# Patient Record
Sex: Female | Born: 1945 | Race: White | Hispanic: No | Marital: Married | Smoking: Never smoker
Health system: Southern US, Community
[De-identification: ages and names within clinical notes are randomized; demographics above are authoritative.]

## PROBLEM LIST (undated history)

## (undated) DIAGNOSIS — I639 Cerebral infarction, unspecified: Secondary | ICD-10-CM

## (undated) DIAGNOSIS — I10 Essential (primary) hypertension: Secondary | ICD-10-CM

## (undated) DIAGNOSIS — R569 Unspecified convulsions: Secondary | ICD-10-CM

## (undated) HISTORY — DX: Cerebral infarction, unspecified: I63.9

---

## 2002-01-13 ENCOUNTER — Ambulatory Visit (HOSPITAL_COMMUNITY): Admission: RE | Admit: 2002-01-13 | Discharge: 2002-01-13 | Payer: Self-pay | Admitting: Internal Medicine

## 2003-01-15 ENCOUNTER — Ambulatory Visit (HOSPITAL_COMMUNITY): Admission: RE | Admit: 2003-01-15 | Discharge: 2003-01-15 | Payer: Self-pay | Admitting: Internal Medicine

## 2004-01-17 ENCOUNTER — Ambulatory Visit (HOSPITAL_COMMUNITY): Admission: RE | Admit: 2004-01-17 | Discharge: 2004-01-17 | Payer: Self-pay | Admitting: Internal Medicine

## 2005-01-24 ENCOUNTER — Ambulatory Visit (HOSPITAL_COMMUNITY): Admission: RE | Admit: 2005-01-24 | Discharge: 2005-01-24 | Payer: Self-pay | Admitting: Internal Medicine

## 2006-02-04 ENCOUNTER — Ambulatory Visit (HOSPITAL_COMMUNITY): Admission: RE | Admit: 2006-02-04 | Discharge: 2006-02-04 | Payer: Self-pay | Admitting: Internal Medicine

## 2007-06-06 ENCOUNTER — Ambulatory Visit (HOSPITAL_COMMUNITY): Admission: RE | Admit: 2007-06-06 | Discharge: 2007-06-06 | Payer: Self-pay | Admitting: Internal Medicine

## 2008-06-16 ENCOUNTER — Ambulatory Visit (HOSPITAL_COMMUNITY): Admission: RE | Admit: 2008-06-16 | Discharge: 2008-06-16 | Payer: Self-pay | Admitting: Internal Medicine

## 2009-07-11 ENCOUNTER — Ambulatory Visit (HOSPITAL_COMMUNITY): Admission: RE | Admit: 2009-07-11 | Discharge: 2009-07-11 | Payer: Self-pay | Admitting: Internal Medicine

## 2010-07-14 ENCOUNTER — Ambulatory Visit (HOSPITAL_COMMUNITY)
Admission: RE | Admit: 2010-07-14 | Discharge: 2010-07-14 | Payer: Self-pay | Source: Home / Self Care | Attending: Internal Medicine | Admitting: Internal Medicine

## 2011-01-22 ENCOUNTER — Other Ambulatory Visit (HOSPITAL_COMMUNITY): Payer: Self-pay | Admitting: Internal Medicine

## 2011-01-22 DIAGNOSIS — Z13820 Encounter for screening for osteoporosis: Secondary | ICD-10-CM

## 2011-01-31 ENCOUNTER — Ambulatory Visit (HOSPITAL_COMMUNITY)
Admission: RE | Admit: 2011-01-31 | Discharge: 2011-01-31 | Disposition: A | Discharge status: Home / Self Care | Payer: Medicare Other | Source: Ambulatory Visit | Attending: Internal Medicine | Admitting: Internal Medicine

## 2011-01-31 DIAGNOSIS — Z13820 Encounter for screening for osteoporosis: Secondary | ICD-10-CM | POA: Insufficient documentation

## 2011-01-31 DIAGNOSIS — Z780 Asymptomatic menopausal state: Secondary | ICD-10-CM | POA: Insufficient documentation

## 2011-07-02 ENCOUNTER — Other Ambulatory Visit (HOSPITAL_COMMUNITY): Payer: Self-pay | Admitting: Internal Medicine

## 2011-07-02 DIAGNOSIS — Z1231 Encounter for screening mammogram for malignant neoplasm of breast: Secondary | ICD-10-CM

## 2011-08-02 ENCOUNTER — Ambulatory Visit (HOSPITAL_COMMUNITY)
Admission: RE | Admit: 2011-08-02 | Discharge: 2011-08-02 | Disposition: A | Discharge status: Home / Self Care | Payer: Medicare Other | Source: Ambulatory Visit | Attending: Internal Medicine | Admitting: Internal Medicine

## 2011-08-02 DIAGNOSIS — Z1231 Encounter for screening mammogram for malignant neoplasm of breast: Secondary | ICD-10-CM | POA: Insufficient documentation

## 2011-08-13 ENCOUNTER — Other Ambulatory Visit: Payer: Self-pay | Admitting: Internal Medicine

## 2011-08-13 DIAGNOSIS — R928 Other abnormal and inconclusive findings on diagnostic imaging of breast: Secondary | ICD-10-CM

## 2011-08-17 ENCOUNTER — Ambulatory Visit
Admission: RE | Admit: 2011-08-17 | Discharge: 2011-08-17 | Disposition: A | Discharge status: Home / Self Care | Payer: Medicare Other | Source: Ambulatory Visit | Attending: Internal Medicine | Admitting: Internal Medicine

## 2011-08-17 DIAGNOSIS — R928 Other abnormal and inconclusive findings on diagnostic imaging of breast: Secondary | ICD-10-CM

## 2012-01-14 ENCOUNTER — Other Ambulatory Visit: Payer: Self-pay | Admitting: Internal Medicine

## 2012-01-14 DIAGNOSIS — R921 Mammographic calcification found on diagnostic imaging of breast: Secondary | ICD-10-CM

## 2012-02-15 ENCOUNTER — Other Ambulatory Visit: Payer: Self-pay | Admitting: Internal Medicine

## 2012-02-15 ENCOUNTER — Ambulatory Visit
Admission: RE | Admit: 2012-02-15 | Discharge: 2012-02-15 | Disposition: A | Discharge status: Home / Self Care | Payer: Medicare Other | Source: Ambulatory Visit | Attending: Internal Medicine | Admitting: Internal Medicine

## 2012-02-15 DIAGNOSIS — R921 Mammographic calcification found on diagnostic imaging of breast: Secondary | ICD-10-CM

## 2012-07-16 ENCOUNTER — Other Ambulatory Visit: Payer: Self-pay | Admitting: Internal Medicine

## 2012-07-16 DIAGNOSIS — R921 Mammographic calcification found on diagnostic imaging of breast: Secondary | ICD-10-CM

## 2012-09-09 ENCOUNTER — Ambulatory Visit
Admission: RE | Admit: 2012-09-09 | Discharge: 2012-09-09 | Disposition: A | Discharge status: Home / Self Care | Payer: Medicare Other | Source: Ambulatory Visit | Attending: Internal Medicine | Admitting: Internal Medicine

## 2012-09-09 DIAGNOSIS — R921 Mammographic calcification found on diagnostic imaging of breast: Secondary | ICD-10-CM

## 2012-12-30 ENCOUNTER — Other Ambulatory Visit: Payer: Self-pay | Admitting: Internal Medicine

## 2012-12-30 DIAGNOSIS — R921 Mammographic calcification found on diagnostic imaging of breast: Secondary | ICD-10-CM

## 2013-02-16 ENCOUNTER — Ambulatory Visit
Admission: RE | Admit: 2013-02-16 | Discharge: 2013-02-16 | Disposition: A | Discharge status: Home / Self Care | Payer: Medicare Other | Source: Ambulatory Visit | Attending: Internal Medicine | Admitting: Internal Medicine

## 2013-02-16 DIAGNOSIS — R921 Mammographic calcification found on diagnostic imaging of breast: Secondary | ICD-10-CM

## 2013-07-21 ENCOUNTER — Other Ambulatory Visit: Payer: Self-pay | Admitting: Internal Medicine

## 2013-07-21 DIAGNOSIS — R921 Mammographic calcification found on diagnostic imaging of breast: Secondary | ICD-10-CM

## 2013-08-20 ENCOUNTER — Ambulatory Visit
Admission: RE | Admit: 2013-08-20 | Discharge: 2013-08-20 | Disposition: A | Discharge status: Home / Self Care | Payer: Medicare Other | Source: Ambulatory Visit | Attending: Internal Medicine | Admitting: Internal Medicine

## 2013-08-20 DIAGNOSIS — R921 Mammographic calcification found on diagnostic imaging of breast: Secondary | ICD-10-CM

## 2014-02-17 ENCOUNTER — Other Ambulatory Visit (HOSPITAL_COMMUNITY): Payer: Self-pay | Admitting: Internal Medicine

## 2014-02-17 DIAGNOSIS — M8580 Other specified disorders of bone density and structure, unspecified site: Secondary | ICD-10-CM

## 2014-03-02 ENCOUNTER — Ambulatory Visit (HOSPITAL_COMMUNITY)
Admission: RE | Admit: 2014-03-02 | Discharge: 2014-03-02 | Disposition: A | Discharge status: Home / Self Care | Payer: Medicare Other | Source: Ambulatory Visit | Attending: Internal Medicine | Admitting: Internal Medicine

## 2014-03-02 DIAGNOSIS — Z780 Asymptomatic menopausal state: Secondary | ICD-10-CM | POA: Diagnosis not present

## 2014-03-02 DIAGNOSIS — Z13820 Encounter for screening for osteoporosis: Secondary | ICD-10-CM | POA: Diagnosis not present

## 2014-03-02 DIAGNOSIS — M8580 Other specified disorders of bone density and structure, unspecified site: Secondary | ICD-10-CM

## 2014-08-25 ENCOUNTER — Other Ambulatory Visit (HOSPITAL_COMMUNITY): Payer: Self-pay | Admitting: Internal Medicine

## 2014-08-25 DIAGNOSIS — Z1231 Encounter for screening mammogram for malignant neoplasm of breast: Secondary | ICD-10-CM

## 2014-08-31 ENCOUNTER — Ambulatory Visit (HOSPITAL_COMMUNITY)
Admission: RE | Admit: 2014-08-31 | Discharge: 2014-08-31 | Disposition: A | Discharge status: Home / Self Care | Payer: Medicare Other | Source: Ambulatory Visit | Attending: Internal Medicine | Admitting: Internal Medicine

## 2014-08-31 DIAGNOSIS — Z1231 Encounter for screening mammogram for malignant neoplasm of breast: Secondary | ICD-10-CM

## 2015-10-17 ENCOUNTER — Other Ambulatory Visit: Payer: Self-pay

## 2015-10-17 DIAGNOSIS — Z1231 Encounter for screening mammogram for malignant neoplasm of breast: Secondary | ICD-10-CM

## 2015-11-03 ENCOUNTER — Ambulatory Visit
Admission: RE | Admit: 2015-11-03 | Discharge: 2015-11-03 | Disposition: A | Discharge status: Home / Self Care | Payer: Medicare Other | Source: Ambulatory Visit

## 2015-11-03 DIAGNOSIS — Z1231 Encounter for screening mammogram for malignant neoplasm of breast: Secondary | ICD-10-CM

## 2016-10-08 ENCOUNTER — Other Ambulatory Visit: Payer: Self-pay | Admitting: Internal Medicine

## 2016-10-08 DIAGNOSIS — Z1231 Encounter for screening mammogram for malignant neoplasm of breast: Secondary | ICD-10-CM

## 2016-11-05 ENCOUNTER — Ambulatory Visit
Admission: RE | Admit: 2016-11-05 | Discharge: 2016-11-05 | Disposition: A | Discharge status: Home / Self Care | Payer: Medicare Other | Source: Ambulatory Visit | Attending: Internal Medicine | Admitting: Internal Medicine

## 2016-11-05 DIAGNOSIS — Z1231 Encounter for screening mammogram for malignant neoplasm of breast: Secondary | ICD-10-CM

## 2017-10-11 ENCOUNTER — Other Ambulatory Visit: Payer: Self-pay | Admitting: Internal Medicine

## 2017-10-11 DIAGNOSIS — Z1231 Encounter for screening mammogram for malignant neoplasm of breast: Secondary | ICD-10-CM

## 2017-11-08 ENCOUNTER — Ambulatory Visit
Admission: RE | Admit: 2017-11-08 | Discharge: 2017-11-08 | Disposition: A | Discharge status: Home / Self Care | Payer: Medicare Other | Source: Ambulatory Visit | Attending: Internal Medicine | Admitting: Internal Medicine

## 2017-11-08 DIAGNOSIS — Z1231 Encounter for screening mammogram for malignant neoplasm of breast: Secondary | ICD-10-CM

## 2018-01-24 ENCOUNTER — Emergency Department (HOSPITAL_BASED_OUTPATIENT_CLINIC_OR_DEPARTMENT_OTHER): Payer: Medicare Other

## 2018-01-24 ENCOUNTER — Other Ambulatory Visit: Payer: Self-pay

## 2018-01-24 ENCOUNTER — Observation Stay (HOSPITAL_BASED_OUTPATIENT_CLINIC_OR_DEPARTMENT_OTHER)
Admission: EM | Admit: 2018-01-24 | Discharge: 2018-01-27 | Disposition: A | Discharge status: Home / Self Care | Payer: Medicare Other | Attending: Internal Medicine | Admitting: Internal Medicine

## 2018-01-24 ENCOUNTER — Encounter (HOSPITAL_BASED_OUTPATIENT_CLINIC_OR_DEPARTMENT_OTHER): Payer: Self-pay

## 2018-01-24 DIAGNOSIS — R42 Dizziness and giddiness: Secondary | ICD-10-CM | POA: Diagnosis present

## 2018-01-24 DIAGNOSIS — I639 Cerebral infarction, unspecified: Secondary | ICD-10-CM

## 2018-01-24 DIAGNOSIS — G459 Transient cerebral ischemic attack, unspecified: Secondary | ICD-10-CM

## 2018-01-24 DIAGNOSIS — G40909 Epilepsy, unspecified, not intractable, without status epilepticus: Secondary | ICD-10-CM | POA: Diagnosis not present

## 2018-01-24 DIAGNOSIS — I672 Cerebral atherosclerosis: Secondary | ICD-10-CM

## 2018-01-24 DIAGNOSIS — Q211 Atrial septal defect: Secondary | ICD-10-CM | POA: Diagnosis not present

## 2018-01-24 DIAGNOSIS — I6330 Cerebral infarction due to thrombosis of unspecified cerebral artery: Principal | ICD-10-CM | POA: Insufficient documentation

## 2018-01-24 DIAGNOSIS — Z79899 Other long term (current) drug therapy: Secondary | ICD-10-CM | POA: Diagnosis not present

## 2018-01-24 DIAGNOSIS — E785 Hyperlipidemia, unspecified: Secondary | ICD-10-CM

## 2018-01-24 DIAGNOSIS — I10 Essential (primary) hypertension: Secondary | ICD-10-CM | POA: Diagnosis not present

## 2018-01-24 DIAGNOSIS — E669 Obesity, unspecified: Secondary | ICD-10-CM | POA: Diagnosis not present

## 2018-01-24 DIAGNOSIS — Z6838 Body mass index (BMI) 38.0-38.9, adult: Secondary | ICD-10-CM | POA: Insufficient documentation

## 2018-01-24 DIAGNOSIS — R4781 Slurred speech: Secondary | ICD-10-CM | POA: Diagnosis not present

## 2018-01-24 DIAGNOSIS — R479 Unspecified speech disturbances: Secondary | ICD-10-CM | POA: Diagnosis present

## 2018-01-24 DIAGNOSIS — Z792 Long term (current) use of antibiotics: Secondary | ICD-10-CM | POA: Insufficient documentation

## 2018-01-24 HISTORY — DX: Essential (primary) hypertension: I10

## 2018-01-24 HISTORY — DX: Unspecified convulsions: R56.9

## 2018-01-24 LAB — COMPREHENSIVE METABOLIC PANEL
ALT: 30 U/L (ref 14–54)
AST: 27 U/L (ref 15–41)
Albumin: 3.8 g/dL (ref 3.5–5.0)
Alkaline Phosphatase: 124 U/L (ref 38–126)
Anion gap: 8 (ref 5–15)
BUN: 15 mg/dL (ref 6–20)
CALCIUM: 9.1 mg/dL (ref 8.9–10.3)
CO2: 24 mmol/L (ref 22–32)
CREATININE: 0.64 mg/dL (ref 0.44–1.00)
Chloride: 110 mmol/L (ref 101–111)
Glucose, Bld: 113 mg/dL — ABNORMAL HIGH (ref 65–99)
Potassium: 3.5 mmol/L (ref 3.5–5.1)
Sodium: 142 mmol/L (ref 135–145)
Total Bilirubin: 0.5 mg/dL (ref 0.3–1.2)
Total Protein: 6.8 g/dL (ref 6.5–8.1)

## 2018-01-24 LAB — CBC
HCT: 40.7 % (ref 36.0–46.0)
HEMOGLOBIN: 14.3 g/dL (ref 12.0–15.0)
MCH: 31.7 pg (ref 26.0–34.0)
MCHC: 35.1 g/dL (ref 30.0–36.0)
MCV: 90.2 fL (ref 78.0–100.0)
Platelets: 248 10*3/uL (ref 150–400)
RBC: 4.51 MIL/uL (ref 3.87–5.11)
RDW: 12.6 % (ref 11.5–15.5)
WBC: 6.2 10*3/uL (ref 4.0–10.5)

## 2018-01-24 LAB — PHENYTOIN LEVEL, TOTAL: PHENYTOIN LVL: 16.8 ug/mL (ref 10.0–20.0)

## 2018-01-24 LAB — RAPID URINE DRUG SCREEN, HOSP PERFORMED
Amphetamines: NOT DETECTED
Benzodiazepines: NOT DETECTED
Cocaine: NOT DETECTED
OPIATES: NOT DETECTED
Tetrahydrocannabinol: NOT DETECTED

## 2018-01-24 LAB — CBG MONITORING, ED: Glucose-Capillary: 93 mg/dL (ref 65–99)

## 2018-01-24 LAB — TROPONIN I

## 2018-01-24 NOTE — ED Provider Notes (Addendum)
[facility] EMERGENCY DEPARTMENT Provider Note   CSN: [id number] Arrival date & time: 01/24/18  2229     History   Chief Complaint Chief Complaint  Patient presents with  . Dizziness    HPI April Mcdonald is a 72 y.o. female.  Pt is 72 y/o with hx of HTN, HLD, and sz disorder presenting today with dizziness which she describes as spinning, slurred speech and not feeling right with minimal headache for about 5 min today that then resolved.  Pt states she feels fine now.  She had no LOC, shaking or weakness to one side of the body.  She was not confused after this event and husband states she had no facial droop.  No aphasia or visual changes.  Pt states she was just sitting watching TV when sx started.  No similar sx in the past.  Pt states last sz was >20 years ago and was described as generalized shaking and loss of consciousness.  She has been on dilantin since and has never had another sx.  Also tonight no CP, SOB, neck pain or abd pain.  No vomiting and pt has not recently been ill or had any med changes.  The history is provided by the patient.    Past Medical History:  Diagnosis Date  . Hypertension   . Seizures (HCC)     There are no active problems to display for this patient.   No past surgical history on file.   OB History   None      Home Medications    Prior to Admission medications   Medication Sig Start Date End Date Taking? Authorizing Provider  amoxicillin (AMOXIL) 500 MG capsule Take 500 mg by mouth 3 (three) times daily.   Yes [provider]  diphenoxylate-atropine (LOMOTIL) 2.5-0.025 MG tablet Take by mouth 4 (four) times daily as needed for diarrhea or loose stools.   Yes [provider]  ibuprofen (ADVIL,MOTRIN) 100 MG tablet Take 100 mg by mouth every 6 (six) hours as needed for fever.   Yes [provider]  phenytoin (DILANTIN) 100 MG ER capsule Take by mouth 3 (three) times daily.   Yes [provider]  triamterene-hydrochlorothiazide (DYAZIDE) 37.5-25 MG capsule Take 1 capsule by mouth daily.   Yes [provider]    Family History Family History  Problem Relation Age of Onset  . Breast cancer Cousin     Social History Social History   Tobacco Use  . Smoking status: Not on file  Substance Use Topics  . Alcohol use: Not on file  . Drug use: Not on file     Allergies   Patient has no allergy information on record.   Review of Systems Review of Systems  All other systems reviewed and are negative.    Physical Exam Updated Vital Signs BP (!) 167/65 (BP Location: Right Arm)   Pulse 67   Temp 98 F (36.7 C) (Oral)   Resp 18   Ht 5\' 2"  (1.575 m)   Wt 93.4 kg (206 lb)   SpO2 99%   BMI 37.68 kg/m   Physical Exam  Constitutional: She is oriented to person, place, and time. She appears well-developed and well-nourished. No distress.  HENT:  Head: Normocephalic and atraumatic.  Mouth/Throat: Oropharynx is clear and moist.  Eyes: Pupils are equal, round, and reactive to light. Conjunctivae and EOM are normal.  Neck: Normal range of motion. Neck supple.  No carotid bruit  Cardiovascular:  Normal rate, regular rhythm and intact distal pulses.  No murmur heard. Pulmonary/Chest: Effort normal and breath sounds normal. No respiratory distress. She has no wheezes. She has no rales.  Abdominal: Soft. She exhibits no distension. There is no tenderness. There is no rebound and no guarding.  Musculoskeletal: Normal range of motion. She exhibits no edema or tenderness.  Neurological: She is alert and oriented to person, place, and time. She has normal strength. No cranial nerve deficit or sensory deficit. Coordination and gait normal.  No pronator drift.  No visual field cuts or nystagmus.  Normal heel to shin and finger to nose bilaterally.  Skin: Skin is warm and dry. No rash noted. No erythema.  Psychiatric: She has a normal mood and affect. Her behavior is  normal.  Nursing note and vitals reviewed.    ED Treatments / Results  Labs (all labs ordered are listed, but only abnormal results are displayed) Labs Reviewed  RAPID URINE DRUG SCREEN, HOSP PERFORMED - Abnormal; Notable for the following components:      Result Value   Barbiturates   (*)    Value: Result not available. Reagent lot number recalled by manufacturer.   All other components within normal limits  COMPREHENSIVE METABOLIC PANEL - Abnormal; Notable for the following components:   Glucose, Bld 113 (*)    All other components within normal limits  CBC  TROPONIN I  PHENYTOIN LEVEL, TOTAL  CBG MONITORING, ED    EKG None ED ECG REPORT   Date: 01/24/2018  Rate: 67  Rhythm: normal sinus rhythm  QRS Axis: normal  Intervals: normal  ST/T Wave abnormalities: normal  Conduction Disutrbances:none  Narrative Interpretation:   Old EKG Reviewed: unchanged  I have personally reviewed the EKG tracing and agree with the computerized printout as noted.  Radiology Ct Head Wo Contrast  Result Date: 01/25/2018 CLINICAL DATA:  Dizziness and slurred speech [place] EXAM: CT HEAD WITHOUT CONTRAST TECHNIQUE: Contiguous axial images were obtained from the base of the skull through the vertex without intravenous contrast. COMPARISON:  None. FINDINGS: Brain: No acute territorial infarction, hemorrhage or intracranial mass. Ventricles are nonenlarged. 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 Mild atrophy. Vascular: No hyperdense vessels.  Carotid vascular calcification Skull: Normal. Negative for fracture or focal lesion. Sinuses/Orbits: Fluid level in the right maxillary sinus consistent with sinusitis. No acute orbital abnormality Other: None IMPRESSION: 1. No CT evidence for acute intracranial abnormality. 2. 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  Atrophy. Electronically Signed   By: Jasmine Pang M.D.   On: 01/25/2018 00:54    Procedures Procedures (including critical care  time)  Medications Ordered in ED Medications - No data to display   Initial Impression / Assessment and Plan / ED Course  I have reviewed the triage vital signs and the nursing notes.  Pertinent labs & imaging results that were available during my care of the patient were reviewed by me and considered in my medical decision making (see chart for details).     Pt presenting today with sx concerning for TIA.  Pt had or so of dizziness which she describes as movement/spinning, slurred speech and didn't feel right.  No facial droop, or unilateral sx.  No prior hx of similar.  Pt does have sz disorder but last sz was >85yrs ago and still on dilantin.  Sx free at this time and normal exam.  She does have risk factors (age, HTN and HLD).  She denies any neck or head pain now.  No recent med changes. TIA work up initiated.  12:45 AM Labs wnl.  CT without acute findings but appearance of old small right posterior frontal infarct.  Pt remains assymptomatic.  Will admit for TIA work up  Final Clinical Impressions(s) / ED Diagnoses   Final diagnoses:  TIA (transient ischemic attack)    ED Discharge Orders    None       Gwyneth Sprout, MD 01/25/18 0111    Gwyneth Sprout, MD 01/25/18 [phone]

## 2018-01-24 NOTE — ED Triage Notes (Signed)
Pt reports she was watching TV approx 10pm and had an episode of dizziness associated with slurred speech. Symptoms have resolved. Neuro intact on assessment. Pt ambulatory inside building.

## 2018-01-25 ENCOUNTER — Observation Stay (HOSPITAL_BASED_OUTPATIENT_CLINIC_OR_DEPARTMENT_OTHER): Payer: Medicare Other

## 2018-01-25 ENCOUNTER — Observation Stay (HOSPITAL_COMMUNITY): Payer: Medicare Other

## 2018-01-25 ENCOUNTER — Encounter (HOSPITAL_COMMUNITY): Payer: Self-pay | Admitting: Family Medicine

## 2018-01-25 ENCOUNTER — Other Ambulatory Visit: Payer: Self-pay

## 2018-01-25 DIAGNOSIS — E785 Hyperlipidemia, unspecified: Secondary | ICD-10-CM | POA: Diagnosis not present

## 2018-01-25 DIAGNOSIS — I10 Essential (primary) hypertension: Secondary | ICD-10-CM | POA: Diagnosis present

## 2018-01-25 DIAGNOSIS — G459 Transient cerebral ischemic attack, unspecified: Secondary | ICD-10-CM | POA: Diagnosis not present

## 2018-01-25 DIAGNOSIS — Z7982 Long term (current) use of aspirin: Secondary | ICD-10-CM | POA: Diagnosis not present

## 2018-01-25 DIAGNOSIS — G40909 Epilepsy, unspecified, not intractable, without status epilepticus: Secondary | ICD-10-CM | POA: Diagnosis not present

## 2018-01-25 DIAGNOSIS — R479 Unspecified speech disturbances: Secondary | ICD-10-CM | POA: Diagnosis not present

## 2018-01-25 DIAGNOSIS — Z79899 Other long term (current) drug therapy: Secondary | ICD-10-CM | POA: Diagnosis not present

## 2018-01-25 DIAGNOSIS — R569 Unspecified convulsions: Secondary | ICD-10-CM | POA: Diagnosis not present

## 2018-01-25 DIAGNOSIS — R42 Dizziness and giddiness: Secondary | ICD-10-CM | POA: Diagnosis present

## 2018-01-25 LAB — LIPID PANEL
Cholesterol: 178 mg/dL (ref 0–200)
HDL: 51 mg/dL (ref >40)
LDL CALC: 110 mg/dL — AB (ref 0–99)
Total CHOL/HDL Ratio: 3.5 RATIO
Triglycerides: 85 mg/dL (ref <150)
VLDL: 17 mg/dL (ref 0–40)

## 2018-01-25 LAB — ECHOCARDIOGRAM COMPLETE
Height: 61 in
WEIGHTICAEL: 3301.61 [oz_av]

## 2018-01-25 LAB — HEMOGLOBIN A1C
Hgb A1c MFr Bld: 5.8 % — ABNORMAL HIGH (ref 4.8–5.6)
Mean Plasma Glucose: 119.76 mg/dL

## 2018-01-25 MED ORDER — IOPAMIDOL (ISOVUE-370) INJECTION 76%
50.0000 mL | Freq: Once | INTRAVENOUS | Status: AC | PRN
  Administered 2018-01-25: 50 mL via INTRAVENOUS

## 2018-01-25 MED ORDER — ATORVASTATIN CALCIUM 80 MG PO TABS: 80.0000 mg | ORAL_TABLET | Freq: Every day | ORAL | 0 refills | Status: AC

## 2018-01-25 MED ORDER — ASPIRIN 81 MG PO TBEC: 81.0000 mg | DELAYED_RELEASE_TABLET | Freq: Every day | ORAL | 0 refills | Status: AC

## 2018-01-25 MED ORDER — ENOXAPARIN SODIUM 40 MG/0.4ML ~~LOC~~ SOLN
40.0000 mg | SUBCUTANEOUS | Status: DC
  Administered 2018-01-25 – 2018-01-27 (×3): 40 mg via SUBCUTANEOUS
  Filled 2018-01-25 (×3): qty 0.4

## 2018-01-25 MED ORDER — CLOPIDOGREL BISULFATE 75 MG PO TABS: 75.0000 mg | ORAL_TABLET | Freq: Every day | ORAL | 0 refills | Status: DC

## 2018-01-25 MED ORDER — SODIUM CHLORIDE 0.9% FLUSH
3.0000 mL | Freq: Two times a day (BID) | INTRAVENOUS | Status: DC
  Administered 2018-01-25 – 2018-01-27 (×3): 3 mL via INTRAVENOUS

## 2018-01-25 MED ORDER — IOPAMIDOL (ISOVUE-370) INJECTION 76%: 50.0000 mL | Freq: Once | INTRAVENOUS | Status: DC | PRN

## 2018-01-25 MED ORDER — TRIAMTERENE-HCTZ 37.5-25 MG PO TABS
1.0000 | ORAL_TABLET | Freq: Every day | ORAL | Status: DC
  Administered 2018-01-25: 1 via ORAL
  Filled 2018-01-25: qty 1

## 2018-01-25 MED ORDER — ATORVASTATIN CALCIUM 10 MG PO TABS: 10.0000 mg | ORAL_TABLET | Freq: Every day | ORAL | Status: DC

## 2018-01-25 MED ORDER — SODIUM CHLORIDE 0.9% FLUSH: 3.0000 mL | INTRAVENOUS | Status: DC | PRN

## 2018-01-25 MED ORDER — ACETAMINOPHEN 325 MG PO TABS: 650.0000 mg | ORAL_TABLET | ORAL | Status: DC | PRN

## 2018-01-25 MED ORDER — ASPIRIN 325 MG PO TABS
325.0000 mg | ORAL_TABLET | Freq: Every day | ORAL | Status: DC
  Administered 2018-01-25: 325 mg via ORAL
  Filled 2018-01-25: qty 1

## 2018-01-25 MED ORDER — ACETAMINOPHEN 160 MG/5ML PO SOLN: 650.0000 mg | ORAL | Status: DC | PRN

## 2018-01-25 MED ORDER — ASPIRIN 300 MG RE SUPP: 300.0000 mg | Freq: Every day | RECTAL | Status: DC

## 2018-01-25 MED ORDER — PHENYTOIN SODIUM EXTENDED 100 MG PO CAPS
100.0000 mg | ORAL_CAPSULE | Freq: Three times a day (TID) | ORAL | Status: DC
  Filled 2018-01-25 (×3): qty 1

## 2018-01-25 MED ORDER — ASPIRIN EC 81 MG PO TBEC
81.0000 mg | DELAYED_RELEASE_TABLET | Freq: Every day | ORAL | Status: DC
  Administered 2018-01-26 – 2018-01-27 (×2): 81 mg via ORAL
  Filled 2018-01-25 (×2): qty 1

## 2018-01-25 MED ORDER — SENNOSIDES-DOCUSATE SODIUM 8.6-50 MG PO TABS: 1.0000 | ORAL_TABLET | Freq: Every evening | ORAL | Status: DC | PRN

## 2018-01-25 MED ORDER — ATORVASTATIN CALCIUM 80 MG PO TABS
80.0000 mg | ORAL_TABLET | Freq: Every day | ORAL | Status: DC
  Administered 2018-01-25 – 2018-01-26 (×2): 80 mg via ORAL
  Filled 2018-01-25 (×2): qty 1

## 2018-01-25 MED ORDER — CLOPIDOGREL BISULFATE 75 MG PO TABS
75.0000 mg | ORAL_TABLET | Freq: Every day | ORAL | Status: DC
  Administered 2018-01-25 – 2018-01-27 (×3): 75 mg via ORAL
  Filled 2018-01-25 (×3): qty 1

## 2018-01-25 MED ORDER — STROKE: EARLY STAGES OF RECOVERY BOOK
Freq: Once | Status: AC
  Administered 2018-01-25: 06:00:00
  Filled 2018-01-25: qty 1

## 2018-01-25 MED ORDER — ACETAMINOPHEN 650 MG RE SUPP: 650.0000 mg | RECTAL | Status: DC | PRN

## 2018-01-25 MED ORDER — SODIUM CHLORIDE 0.9 % IV SOLN: 250.0000 mL | INTRAVENOUS | Status: DC | PRN

## 2018-01-25 MED ORDER — ONDANSETRON HCL 4 MG/2ML IJ SOLN: 4.0000 mg | Freq: Four times a day (QID) | INTRAMUSCULAR | Status: DC | PRN

## 2018-01-25 MED ORDER — TRIAMTERENE-HCTZ 37.5-25 MG PO CAPS: 1.0000 | ORAL_CAPSULE | Freq: Every day | ORAL | Status: AC

## 2018-01-25 NOTE — H&P (Signed)
History and Physical    April KnudsenGwyn O Mcdonald ONG:[id number] DOB: [birth year] DOA: 01/24/2018  PCP: Roberts GaudyBurns, Kevin L., MD   [facility]  Chief Complaint: Slurred speech, dizziness   HPI: April KnudsenGwyn O Mcdonald is a 72 y.o. female with medical history significant for hypertension, hyperlipidemia, and seizure disorder, now presenting to the emergency department after an episode of slurred speech and dizziness.  Patient reports that she was in her usual state of health and having an uneventful day when she was typing on a computer and watching TV at approximately 10 PM last night.  She then developed acute onset of dizziness, described as though the room was spinning, and was noted to have dysarthria at that time.  Her husband, present during the episode, is here at the bedside and reports that the symptoms seem to resolve within about 5 minutes.  Patient denies any recent illness, fall, or trauma, and denies experiencing these symptoms previously.  There was no palpitations or chest discomfort and no swelling or tenderness in the lower extremity.  [facility] ED Course: Upon arrival to the ED, patient is found to be afebrile, saturating well on room air, and with vitals otherwise normal.  EKG features a sinus rhythm and noncontrast head CT is negative for acute intracranial abnormality, but notable for an old small right posterior frontal infarct.  Chemistry panel and CBC are unremarkable, troponin is undetectable, UDS is negative, and phenytoin level is therapeutic.  [facility] further evaluation and management of transient dysarthria and dizziness concerning for possible TIA/CVA.  Review of Systems:  All other systems reviewed and apart from HPI, are negative.  Past Medical History:  Diagnosis Date  . Hypertension   . Seizures (HCC)     History reviewed. No pertinent surgical history.   reports that she has never smoked. She has  never used smokeless tobacco. She reports that she does not drink alcohol or use drugs.   Family History  Problem Relation Age of Onset  . Breast cancer Cousin      Prior to Admission medications   Medication Sig Start Date End Date Taking? Authorizing Provider  amoxicillin (AMOXIL) 500 MG capsule Take 500 mg by mouth 3 (three) times daily.   Yes [provider]  diphenoxylate-atropine (LOMOTIL) 2.5-0.025 MG tablet Take by mouth 4 (four) times daily as needed for diarrhea or loose stools.   Yes [provider]  ibuprofen (ADVIL,MOTRIN) 100 MG tablet Take 100 mg by mouth every 6 (six) hours as needed for fever.   Yes [provider]  phenytoin (DILANTIN) 100 MG ER capsule Take by mouth 3 (three) times daily.   Yes [provider]  triamterene-hydrochlorothiazide (DYAZIDE) 37.5-25 MG capsule Take 1 capsule by mouth daily.   Yes [provider]    Physical Exam: Vitals:   01/25/18 0030 01/25/18 0153 01/25/18 0208 01/25/18 0300  BP: (!) 123/50 136/62  (!) 155/61  Pulse: 62 65  71  Resp: 18 18  17   Temp:   98.4 F (36.9 C) (!) 97.5 F (36.4 C)  TempSrc:    Oral  SpO2: 99% 99%  100%  Weight:    93.6 kg (206 lb 5.6 oz)  Height:    5\' 1"  (1.549 m)      Constitutional: NAD, calm  Eyes: PERTLA, lids and conjunctivae normal ENMT: Mucous membranes are moist. No deformity.   Neck: normal, supple, no masses, no  thyromegaly Respiratory: clear to auscultation bilaterally, no wheezing, no crackles. Normal respiratory effort.    Cardiovascular: S1 & S2 heard, regular rate and rhythm. No extremity edema.   Abdomen: No distension, no tenderness, soft. Bowel sounds normal.  Musculoskeletal: no clubbing / cyanosis. No joint deformity upper and lower extremities.   Skin: no significant rashes, lesions, ulcers. Warm, dry, well-perfused. Neurologic: No facial asymmetry, PERRL, EOMI. Sensation intact. No motor deficit appreciated.  Psychiatric:  Alert  and oriented x 3. Pleasant and cooperative.     Labs on Admission: I have personally reviewed following labs and imaging studies  CBC: Recent Labs  Lab 01/24/18 2322  WBC 6.2  HGB 14.3  HCT 40.7  MCV 90.2  PLT 248   Basic Metabolic Panel: Recent Labs  Lab 01/24/18 2322  NA 142  K 3.5  CL 110  CO2 24  GLUCOSE 113*  BUN 15  CREATININE 0.64  CALCIUM 9.1   GFR: Estimated Creatinine Clearance: 66.3 mL/min (by C-G formula based on SCr of 0.64 mg/dL). Liver Function Tests: Recent Labs  Lab 01/24/18 2322  AST 27  ALT 30  ALKPHOS 124  BILITOT 0.5  PROT 6.8  ALBUMIN 3.8   No results for input(s): LIPASE, AMYLASE in the last 168 hours. No results for input(s): AMMONIA in the last 168 hours. Coagulation Profile: No results for input(s): INR, PROTIME in the last 168 hours. Cardiac Enzymes: Recent Labs  Lab 01/24/18 2322  TROPONINI <0.03   BNP (last 3 results) No results for input(s): PROBNP in the last 8760 hours. HbA1C: No results for input(s): HGBA1C in the last 72 hours. CBG: Recent Labs  Lab 01/24/18 2253  GLUCAP 93   Lipid Profile: No results for input(s): CHOL, HDL, LDLCALC, TRIG, CHOLHDL, LDLDIRECT in the last 72 hours. Thyroid Function Tests: No results for input(s): TSH, T4TOTAL, FREET4, T3FREE, THYROIDAB in the last 72 hours. Anemia Panel: No results for input(s): VITAMINB12, FOLATE, FERRITIN, TIBC, IRON, RETICCTPCT in the last 72 hours. Urine analysis: No results found for: COLORURINE, APPEARANCEUR, LABSPEC, PHURINE, GLUCOSEU, HGBUR, BILIRUBINUR, KETONESUR, PROTEINUR, UROBILINOGEN, NITRITE, LEUKOCYTESUR Sepsis Labs: @LABRCNTIP (procalcitonin:4,lacticidven:4) )No results found for this or any previous visit (from the past 240 hour(s)).   Radiological Exams on Admission: Ct Head Wo Contrast  Result Date: 01/25/2018 CLINICAL DATA:  Dizziness and slurred speech [place] EXAM: CT HEAD WITHOUT CONTRAST TECHNIQUE: Contiguous axial images were obtained  from the base of the skull through the vertex without intravenous contrast. COMPARISON:  None. FINDINGS: Brain: No acute territorial infarction, hemorrhage or intracranial mass. Ventricles are nonenlarged. 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 Mild atrophy. Vascular: No hyperdense vessels.  Carotid vascular calcification Skull: Normal. Negative for fracture or focal lesion. Sinuses/Orbits: Fluid level in the right maxillary sinus consistent with sinusitis. No acute orbital abnormality Other: None IMPRESSION: 1. No CT evidence for acute intracranial abnormality. 2. 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  Atrophy. Electronically Signed   By: Jasmine Pang M.D.   On: 01/25/2018 00:54    EKG: Independently reviewed. Sinus rhythm.   Assessment/Plan   1. Transient dysarthria  - Presents following an episode of dysarthria and dizziness that resolved after ~5 minutes  - Head CT was negative for acute findings and no focal deficit identified  - Check MRI brain, MRA head, carotid dopplers, echocardiogram, fasting lipids, A1c  - Continue cardiac monitoring with frequent neuro checks  - Continue Lipitor, ASA   2. Hypertension  - BP at goal  - Continue triamterene-HCTZ   3. Hyperlipidemia  -  Continue Lipitor    4. Seizure disorder  - Hx of generalized seizures, none in many years  - Continues to take Dilantin and level therapeutic in ED     DVT prophylaxis:  Loveonx Code Status: Full  Family Communication: Husband and daughter updated at bedside Consults called: None Admission status: Observation     Briscoe Deutscher, MD [facility] Pager [phone]  If 7PM-7AM, please contact night-coverage [email or web address] Password [facility] : [facility]  01/25/2018, 4:09 AM

## 2018-01-25 NOTE — Consult Note (Signed)
Referring Physician: Dr Antionette Char Consult reason: Stroke  Chief Complaint: none  HPI: April Mcdonald is an 72 y.o. female who has medical history of HTN, HLD and seizure disorder. She presented to a local Urgent Care center on evening of 6/21 with sudden onset of dizziness, slurred speech that lasted about 5-48min and then she states she simply felt "unwell". Urgent care sent her to [facility] ER for TIA work up. CTH showed no acute findings, but MRI shows. She denies any CP or palpitations or SOB. She had no LOC or vision changes. She said she had a focal right h/a thereafter, but this is now gone and all symptoms have resolved at this time. NIH 0. To note, her seizure disorder has been well controlled on Dilantin and has been seizure free for over 77yrs and at baseline is healthy and active (mRS 0).  Past Medical History Past Medical History:  Diagnosis Date  . Hypertension   . Seizures (HCC)   Hyperlipidemia  Surgical History History reviewed. No pertinent surgical history.  Family History  Family History  Problem Relation Age of Onset  . Breast cancer Cousin     Social History:   reports that she has never smoked. She has never used smokeless tobacco. She reports that she does not drink alcohol or use drugs.  Allergies:  Not on File  Home Medications:  Medications Prior to Admission  Medication Sig Dispense Refill  . amoxicillin (AMOXIL) 500 MG capsule Take 500 mg by mouth 3 (three) times daily.    . diphenoxylate-atropine (LOMOTIL) 2.5-0.025 MG tablet Take by mouth 4 (four) times daily as needed for diarrhea or loose stools.    Marland Kitchen ibuprofen (ADVIL,MOTRIN) 100 MG tablet Take 100 mg by mouth every 6 (six) hours as needed for fever.    . phenytoin (DILANTIN) 100 MG ER capsule Take by mouth 3 (three) times daily.    Marland Kitchen triamterene-hydrochlorothiazide (DYAZIDE) 37.5-25 MG capsule Take 1 capsule by mouth daily.      Hospital Medications . aspirin  300 mg Rectal Daily   Or  . aspirin  325 mg  Oral Daily  . atorvastatin  10 mg Oral q1800  . enoxaparin (LOVENOX) injection  40 mg Subcutaneous Q24H  . phenytoin  100 mg Oral TID  . sodium chloride flush  3 mL Intravenous Q12H    ROS:  History obtained from pt  General ROS: negative for - chills, fatigue, fever, night sweats, weight gain or weight loss Psychological ROS: negative for - behavioral disorder, hallucinations, memory difficulties, mood swings or suicidal ideation Ophthalmic ROS: negative for - blurry vision, double vision, eye pain or loss of vision ENT ROS: negative for - epistaxis, nasal discharge, oral lesions, sore throat, tinnitus or vertigo Allergy and Immunology ROS: negative for - hives or itchy/watery eyes Hematological and Lymphatic ROS: negative for - bleeding problems, bruising or swollen lymph nodes Endocrine ROS: negative for - galactorrhea, hair pattern changes, polydipsia/polyuria or temperature intolerance Respiratory ROS: negative for - cough, hemoptysis, shortness of breath or wheezing Cardiovascular ROS: negative for - chest pain, dyspnea on exertion, edema or irregular heartbeat Gastrointestinal ROS: negative for - abdominal pain, diarrhea, hematemesis, nausea/vomiting or stool incontinence Genito-Urinary ROS: negative for - dysuria, hematuria, incontinence or urinary frequency/urgency Musculoskeletal ROS: negative for - joint swelling or muscular weakness Neurological ROS: as noted in HPI Dermatological ROS: negative for rash and skin lesion changes   Physical Examination:  Vitals:   01/25/18 1610 01/25/18 0725 01/25/18 0915 01/25/18 1114  BP: (!) 148/67 (!) 147/68 (!) 133/49 (!) 156/84  Pulse: 66 67 82 69  Resp: 18 17    Temp: 97.8 F (36.6 C) 98.1 F (36.7 C)    TempSrc: Oral Oral    SpO2: 100% 97% 100% 95%  Weight:      Height:        General - no acute distress Heart - Regular rate and rhythm - no murmer appreciated Lungs - Clear to auscultation  Abdomen - Soft - non  tender Extremities - Distal pulses intact - no edema Skin - Warm and dry   Neurologic Examination:  Mental Status: Alert, oriented, thought content appropriate.  Speech fluent without evidence of aphasia. Able to follow 3 step commands without difficulty. Cranial Nerves: II: Discs not visualized; Visual fields grossly normal, pupils equal, round, reactive to light III,IV, VI: ptosis not present, extra-ocular motions intact bilaterally V,VII: smile symmetric, facial light touch sensation normal bilaterally VIII: hearing normal bilaterally IX,X: gag reflex present XI: bilateral shoulder shrug XII: midline tongue extension Motor: RUE - 5/5    LUE - 5/5   RLE - 5/5    LLE - 5/5 Tone and bulk:normal tone throughout; no atrophy noted Sensory: Light touch intact throughout, bilaterally Deep Tendon Reflexes: 2+ and symmetric throughout Plantars: Right: downgoing   Left: downgoing Cerebellar: normal finger-to-nose and normal heel-to-shin test Gait: deferred at this time.   NIHSS 1a Level of Conscious: 1b LOC Questions:  1c LOC Commands:  2 Best Gaze:  3 Visual:  4 Facial Palsy:  5a Motor Arm - left:  5b Motor Arm - Right:  6a Motor Leg - Left:  6b Motor Leg - Right:  7 Limb Ataxia:  8 Sensory:  9 Best Language:  10 Dysarthria: 11 Extinct. and Inattention: TOTAL: 0   LABORATORY STUDIES:  Basic Metabolic Panel: Recent Labs  Lab 01/24/18 2322  NA 142  K 3.5  CL 110  CO2 24  GLUCOSE 113*  BUN 15  CREATININE 0.64  CALCIUM 9.1    Liver Function Tests: Recent Labs  Lab 01/24/18 2322  AST 27  ALT 30  ALKPHOS 124  BILITOT 0.5  PROT 6.8  ALBUMIN 3.8   No results for input(s): LIPASE, AMYLASE in the last 168 hours. No results for input(s): AMMONIA in the last 168 hours.  CBC: Recent Labs  Lab 01/24/18 2322  WBC 6.2  HGB 14.3  HCT 40.7  MCV 90.2  PLT 248    Cardiac Enzymes: Recent Labs  Lab 01/24/18 2322  TROPONINI <0.03    BNP: Invalid  input(s): POCBNP  CBG: Recent Labs  Lab 01/24/18 2253  GLUCAP 93    Microbiology:   Coagulation Studies: No results for input(s): LABPROT, INR in the last 72 hours.  Urinalysis: No results for input(s): COLORURINE, LABSPEC, PHURINE, GLUCOSEU, HGBUR, BILIRUBINUR, KETONESUR, PROTEINUR, UROBILINOGEN, NITRITE, LEUKOCYTESUR in the last 168 hours.  Invalid input(s): APPERANCEUR  Lipid Panel:     Component Value Date/Time   CHOL 178 01/25/2018 0652   TRIG 85 01/25/2018 0652   HDL 51 01/25/2018 0652   CHOLHDL 3.5 01/25/2018 0652   VLDL 17 01/25/2018 0652   LDLCALC 110 (H) 01/25/2018 0652    HgbA1C:  Lab Results  Component Value Date   HGBA1C 5.8 (H) 01/25/2018    Urine Drug Screen:      Component Value Date/Time   LABOPIA NONE DETECTED 01/24/2018 2257   COCAINSCRNUR NONE DETECTED 01/24/2018 2257   LABBENZ NONE DETECTED 01/24/2018 2257  AMPHETMU NONE DETECTED 01/24/2018 2257   THCU NONE DETECTED 01/24/2018 2257   LABBARB (A) 01/24/2018 2257    Result not available. Reagent lot number recalled by manufacturer.     IMAGING: Ct Head Wo Contrast  Result Date: 01/25/2018 CLINICAL DATA:  Dizziness and slurred speech church EXAM: CT HEAD WITHOUT CONTRAST TECHNIQUE: Contiguous axial images were obtained from the base of the skull through the vertex without intravenous contrast. COMPARISON:  None. FINDINGS: Brain: No acute territorial infarction, hemorrhage or intracranial mass. Ventricles are nonenlarged. 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 Mild atrophy. Vascular: No hyperdense vessels.  Carotid vascular calcification Skull: Normal. Negative for fracture or focal lesion. Sinuses/Orbits: Fluid level in the right maxillary sinus consistent with sinusitis. No acute orbital abnormality Other: None IMPRESSION: 1. No CT evidence for acute intracranial abnormality. 2. 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  Atrophy. Electronically Signed   By: Jasmine Pang M.D.   On: 01/25/2018  00:54   Mr Brain Wo Contrast  Result Date: 01/25/2018 CLINICAL DATA:  Initial evaluation for acute TIA, dizziness with slurred speech. EXAM: MRI HEAD WITHOUT CONTRAST MRA HEAD WITHOUT CONTRAST TECHNIQUE: Multiplanar, multiecho pulse sequences of the brain and surrounding structures were obtained without intravenous contrast. Angiographic images of the head were obtained using MRA technique without contrast. COMPARISON:  Prior CT from 01/24/2018. FINDINGS: MRI HEAD FINDINGS Brain: Cerebral volume within normal limits. Small remote right frontal cortical infarct noted. Patchy multifocal areas of restricted diffusion seen involving the bilateral cerebellar hemispheres, right greater than left, consistent with acute ischemic infarcts. Largest area of infarction measures 16 mm. No associated hemorrhage or mass effect. No other evidence for acute or subacute ischemia. No acute or chronic intracranial hemorrhage. No mass lesion, midline shift or mass effect. No hydrocephalus or extra-axial fluid collection. Pituitary gland within normal limits. Vascular: Major intracranial vascular flow voids maintained. Skull and upper cervical spine: Craniocervical junction normal. Upper cervical spine within normal limits. Normal bone marrow signal intensity. Scalp soft tissues unremarkable. Sinuses/Orbits: Globes and orbital soft tissues within normal limits. Small air-fluid level noted within the right maxillary sinus. Small bilateral mastoid effusions, of doubtful significance. Inner ear structures within normal limits. Other: None. MRA HEAD FINDINGS ANTERIOR CIRCULATION: Distal cervical segments of the internal carotid arteries are patent with antegrade flow. Petrous, cavernous, and supraclinoid segments demonstrate mild atheromatous irregularity but are patent to the termini without hemodynamically significant stenosis. A1 segments, anterior communicating artery common anterior cerebral arteries patent bilaterally. No M1  stenosis or occlusion. Normal MCA bifurcations. Distal MCA branches well perfused and symmetric mild small vessel atheromatous irregularity. POSTERIOR CIRCULATION: Right vertebral artery dominant and patent to the vertebrobasilar junction without stenosis. Hypoplastic left vertebral artery, also patent to the vertebrobasilar junction. Posterior inferior cerebral arteries patent bilaterally. Basilar diminutive but patent to its distal aspect without significant stenosis. Superior cerebral arteries patent bilaterally. Fetal type origin of the right PCA. Hypoplastic left P1 with prominent left posterior communicating artery. PCAs patent to their distal aspects without stenosis. Mild small vessel atheromatous irregularity. IMPRESSION: MRI HEAD IMPRESSION: 1. Patchy acute ischemic nonhemorrhagic infarcts involving the bilateral cerebellar hemispheres, right greater than left. 2. Small remote right frontal cortical infarct. 3. Air-fluid level within the right maxillary sinus, suggesting acute sinusitis. MRA HEAD IMPRESSION: 1. Mild for age intracranial atherosclerotic change without large vessel occlusion or hemodynamically significant stenosis. 2. Predominant fetal type origin of the PCAs with overall diminutive vertebrobasilar system. Electronically Signed   By: Rise Mu M.D.   On: 01/25/2018  06:56   Mr Shirlee LatchMra Head ZOWo Contrast  Result Date: 01/25/2018 CLINICAL DATA:  Initial evaluation for acute TIA, dizziness with slurred speech. EXAM: MRI HEAD WITHOUT CONTRAST MRA HEAD WITHOUT CONTRAST TECHNIQUE: Multiplanar, multiecho pulse sequences of the brain and surrounding structures were obtained without intravenous contrast. Angiographic images of the head were obtained using MRA technique without contrast. COMPARISON:  Prior CT from 01/24/2018. FINDINGS: MRI HEAD FINDINGS Brain: Cerebral volume within normal limits. Small remote right frontal cortical infarct noted. Patchy multifocal areas of restricted  diffusion seen involving the bilateral cerebellar hemispheres, right greater than left, consistent with acute ischemic infarcts. Largest area of infarction measures 16 mm. No associated hemorrhage or mass effect. No other evidence for acute or subacute ischemia. No acute or chronic intracranial hemorrhage. No mass lesion, midline shift or mass effect. No hydrocephalus or extra-axial fluid collection. Pituitary gland within normal limits. Vascular: Major intracranial vascular flow voids maintained. Skull and upper cervical spine: Craniocervical junction normal. Upper cervical spine within normal limits. Normal bone marrow signal intensity. Scalp soft tissues unremarkable. Sinuses/Orbits: Globes and orbital soft tissues within normal limits. Small air-fluid level noted within the right maxillary sinus. Small bilateral mastoid effusions, of doubtful significance. Inner ear structures within normal limits. Other: None. MRA HEAD FINDINGS ANTERIOR CIRCULATION: Distal cervical segments of the internal carotid arteries are patent with antegrade flow. Petrous, cavernous, and supraclinoid segments demonstrate mild atheromatous irregularity but are patent to the termini without hemodynamically significant stenosis. A1 segments, anterior communicating artery common anterior cerebral arteries patent bilaterally. No M1 stenosis or occlusion. Normal MCA bifurcations. Distal MCA branches well perfused and symmetric mild small vessel atheromatous irregularity. POSTERIOR CIRCULATION: Right vertebral artery dominant and patent to the vertebrobasilar junction without stenosis. Hypoplastic left vertebral artery, also patent to the vertebrobasilar junction. Posterior inferior cerebral arteries patent bilaterally. Basilar diminutive but patent to its distal aspect without significant stenosis. Superior cerebral arteries patent bilaterally. Fetal type origin of the right PCA. Hypoplastic left P1 with prominent left posterior communicating  artery. PCAs patent to their distal aspects without stenosis. Mild small vessel atheromatous irregularity. IMPRESSION: MRI HEAD IMPRESSION: 1. Patchy acute ischemic nonhemorrhagic infarcts involving the bilateral cerebellar hemispheres, right greater than left. 2. Small remote right frontal cortical infarct. 3. Air-fluid level within the right maxillary sinus, suggesting acute sinusitis. MRA HEAD IMPRESSION: 1. Mild for age intracranial atherosclerotic change without large vessel occlusion or hemodynamically significant stenosis. 2. Predominant fetal type origin of the PCAs with overall diminutive vertebrobasilar system. Electronically Signed   By: Rise MuBenjamin  McClintock M.D.   On: 01/25/2018 06:56      Assessment: 72 y.o. female with posterior circulation stroke symptoms lasting only 5-10310min. Stroke Risk Factors are age, HTN, HLD.   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 There is a tiny L Vert that is irregular and atheromatus. Rt Vert is dominate. Stroke work up underway, however with this intracranial vessel disease and direct vascular territory infarcts, it seems most likely vessel to vessel thrombotic etiology.  # HLD- LDL 110, goal <70 with athero dz. High intensity statin ordered for max tx and secondary prevention # Seizure disorder- well controlled on Dilantin. I did discuss switching to keppra briefly (as out pt) as Dilantin at her age is not the most beneficial d/t osteoporotic effects and increase for falls, fractures. She has never had out pt neurologist.   Plan:  Lipitor 80mg  PO daily  DAPT for 3 weeks, then monotherapy  PT consult, OT consult, Speech consult is not  needed  Echocardiogram pending  Carotid dopplers pending  Allow for permissive hypertension for the first 24-48h - only treat PRN if SBP >220 mmHg. Blood pressures can be gradually normalized to SBP<140 upon discharge  Risk factor modification, stroke education  Telemetry  monitoring  Frequent neuro checks  Fall Precautions  DVT prophelaxis  Already cleared for PO since there are NO stroke symptoms at this time  Recommend out pt neurology f/u with [facility]  We will f/u on stroke work up   Discussed with Dr Laurence Slate. Attending Neurologist's note to follow

## 2018-01-25 NOTE — Progress Notes (Signed)
April Mcdonald is a 72 y.o. female with medical history significant for hypertension, hyperlipidemia, and seizure disorder, now presenting to the emergency department after an episode of slurred speech and dizziness.  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  Echo does not show any embolus.  Vascular duplex does not show any sig stenosis.  Started her on aspirin and plavix.  CT angio of the head and neck ordered by neurology and pending.  Possible loop recorder before discharge.   Kathlen Modyvijaya Vedika Dumlao, MD [phone]

## 2018-01-25 NOTE — Evaluation (Signed)
Physical Therapy Evaluation Patient Details Name: April Mcdonald MRN: [id number] DOB: [birth year] Today's Date: 01/25/2018   History of Present Illness  April Mcdonald is a 72 y.o. female with medical history significant for hypertension, hyperlipidemia, and seizure disorder, now presenting to the emergency department after an episode of slurred speech and dizziness.  MRI showed acute infarcts bilateral cerebellar region R>L.   Clinical Impression  Patient evaluated by Physical Therapy with no further acute PT needs identified. All education has been completed and the patient has no further questions. Pt's symptoms have resolved and she is at baseline independence with mobility. Pt ambulated on unit as well as practicing stairs with no acute symptoms. No further PT or OT needs.  See below for any follow-up Physical Therapy or equipment needs. PT is signing off. Thank you for this referral.     Follow Up Recommendations No PT follow up    Equipment Recommendations  None recommended by PT    Recommendations for Other Services       Precautions / Restrictions Precautions Precautions: None Restrictions Weight Bearing Restrictions: No      Mobility  Bed Mobility Overal bed mobility: Independent                Transfers Overall transfer level: Independent Equipment used: None                Ambulation/Gait Ambulation/Gait assistance: Independent Gait Distance (Feet): 300 Feet Assistive device: None Gait Pattern/deviations: WFL(Within Functional Limits) Gait velocity: WFL Gait velocity interpretation: >4.37 ft/sec, indicative of normal walking speed General Gait Details: pt ambulated without difficulty, no LOB, no AD needed  Stairs Stairs: Yes Stairs assistance: Modified independent (Device/Increase time) Stair Management: One rail Right;Alternating pattern;Forwards Number of Stairs: 10 General stair comments: pt winded at top of stairs, HR up to 104 but dropped  back into 90's within 5 sec. O2 sats stable in 90's  Wheelchair Mobility    Modified Rankin (Stroke Patients Only) Modified Rankin (Stroke Patients Only) Pre-Morbid Rankin Score: No symptoms Modified Rankin: No symptoms     Balance Overall balance assessment: Modified Independent                                           Pertinent Vitals/Pain Pain Assessment: No/denies pain    Home Living Family/patient expects to be discharged to:: Private residence Living Arrangements: Spouse/significant other;Children;Non-relatives/Friends Available Help at Discharge: Family;Available 24 hours/day Type of Home: House Home Access: Stairs to enter   [employer] of Steps: 3 Home Layout: One level Home Equipment: None      Prior Function Level of Independence: Independent         Comments: pt has recently begun participating in [employer] at the [employer]        Extremity/Trunk Assessment   Upper Extremity Assessment Upper Extremity Assessment: Overall WFL for tasks assessed    Lower Extremity Assessment Lower Extremity Assessment: Overall WFL for tasks assessed    Cervical / Trunk Assessment Cervical / Trunk Assessment: Normal  Communication   Communication: No difficulties  Cognition Arousal/Alertness: Awake/alert Behavior During Therapy: WFL for tasks assessed/performed Overall Cognitive Status: Within Functional Limits for tasks assessed  General Comments General comments (skin integrity, edema, etc.): reviewed sxs of CVA. Encourged continued participation in physical activity for overall health and weight management    Exercises     Assessment/Plan    PT Assessment Patent does not need any further PT services  PT Problem List         PT Treatment Interventions      PT Goals (Current goals can be found in the Care Plan section)  Acute Rehab PT  Goals Patient Stated Goal: return home today PT Goal Formulation: All assessment and education complete, DC therapy    Frequency     Barriers to discharge        Co-evaluation               AM-PAC PT "6 Clicks" Daily Activity  Outcome Measure Difficulty turning over in bed (including adjusting bedclothes, sheets and blankets)?: None Difficulty moving from lying on back to sitting on the side of the bed? : None Difficulty sitting down on and standing up from a chair with arms (e.g., wheelchair, bedside commode, etc,.)?: None Help needed moving to and from a bed to chair (including a wheelchair)?: None Help needed walking in hospital room?: None Help needed climbing 3-5 steps with a railing? : None 6 Click Score: 24    End of Session   Activity Tolerance: Patient tolerated treatment well Patient left: in bed;with call bell/phone within reach;with family/visitor present Nurse Communication: Mobility status PT Visit Diagnosis: Other abnormalities of gait and mobility (R26.89)    Time: [id number] PT Time Calculation (min) (ACUTE ONLY): 37 min   Charges:   PT Evaluation $PT Eval Low Complexity: 1 Low PT Treatments $Gait Training: 8-22 mins   PT G Codes:        Lyanne CoVictoria Rhylan Kagel, PT  Acute Rehab Services  [phone]   [place]ia L Rochell Puett 01/25/2018, 11:15 AM

## 2018-01-25 NOTE — Progress Notes (Signed)
OT Cancellation Note  Patient Details Name: April Mcdonald MRN: [id number] DOB: [birth year]   Cancelled Treatment:    Reason Eval/Treat Not Completed: OT screened, no needs identified, will sign off. PT notified OT that pt has no OT needs and PT checked pt's vision. OT signing off.  Earlie RavelingLindsey L Jocob Dambach OTR/L 01/25/2018, 9:55 AM

## 2018-01-25 NOTE — Progress Notes (Signed)
[facility] alert and oriented with no neurological deficits, she is on cardiac monitor, and will be getting neuro checks and vital signs every 2 hours until 1030. Will continue to monitor.

## 2018-01-25 NOTE — ED Notes (Addendum)
[facility] arrived to transport pt to [facility] 

## 2018-01-25 NOTE — Progress Notes (Signed)
Pharmacy received a call from nursing with as the patient was refusing to take hospital medications. After the call, I went to speak with [name] (with her husband present) about hospital policy for medications and she was still refusing to take hospital provided medications that she had prior to admission because she would prefer to take her own supply of medicine. Since she had the medication bottles in her room, I offered to make them "home medications" and dispense them in the pharmacy per hospital policy and she was adamant that, "we would not take her medications and charge her for them."   I verified with her that she was refusing hospital supplied medications and was also refusing for pharmacy to store her home medications. I also verified with her that she understood this meant she could not take medications that she was storing in her purse as this was against hospital policy and she remained adamant with her decision.   MD to be notified, spoke with nurse.  Nolen MuAustin J Lucas PharmD PGY1 Acute Care Pharmacy Resident 01/25/2018 11:05 AM Phone: [phone] until 3:30 then check AMION

## 2018-01-25 NOTE — Plan of Care (Signed)
Mrs. April Mcdonald and her husband were adamant that she take her home medications on her own home schedule.  Dr. Blake DivineAkula is aware, and a pharmacist visited the patient in the room to explain policy on home medications to no avail.  Otherwise, she has been calm, cooperative and anxious to be home.  Stroke education, potential complications and prevention will be essential for this patient, as she/spouse seem to have difficulty understanding why she is still here with no symptoms.    Anticipate discharge for tomorrow, pending results of scans and recommendation from neurology.

## 2018-01-25 NOTE — ED Notes (Signed)
Pt ambulated to restroom without assistance. Steady gait. NAD noted

## 2018-01-25 NOTE — Progress Notes (Signed)
VASCULAR LAB PRELIMINARY  PRELIMINARY  PRELIMINARY  PRELIMINARY  Carotid duplex completed.    Preliminary report:  1-39% ICA stenosis. Vertebral artery flow is antegrade.   Florrie Ramires, RVT 01/25/2018, 12:53 PM

## 2018-01-25 NOTE — Progress Notes (Signed)
*  PRELIMINARY RESULTS* Echocardiogram 2D Echocardiogram has been performed.  Jeryl Columbialliott, Pasqualina Colasurdo 01/25/2018, 1:42 PM

## 2018-01-26 DIAGNOSIS — R479 Unspecified speech disturbances: Secondary | ICD-10-CM | POA: Diagnosis not present

## 2018-01-26 DIAGNOSIS — G40909 Epilepsy, unspecified, not intractable, without status epilepticus: Secondary | ICD-10-CM

## 2018-01-26 DIAGNOSIS — I639 Cerebral infarction, unspecified: Secondary | ICD-10-CM | POA: Diagnosis not present

## 2018-01-26 DIAGNOSIS — I6330 Cerebral infarction due to thrombosis of unspecified cerebral artery: Secondary | ICD-10-CM

## 2018-01-26 DIAGNOSIS — I10 Essential (primary) hypertension: Secondary | ICD-10-CM

## 2018-01-26 DIAGNOSIS — E785 Hyperlipidemia, unspecified: Secondary | ICD-10-CM | POA: Diagnosis not present

## 2018-01-26 NOTE — Progress Notes (Signed)
Patient refusing to take Phenytoin prescribed to her. Pt has her home medication bottle of Phenytoin at the bedside ; which she states she will take her dosage prescribed by her MD at nighttime. Pt has been told several of times that she cant have medication from home at the bedside and she refuses to give medication bottle to pharmacy so it can be verified. MD Blake Divinekula has been notified of the situation. Will continue to monitor.

## 2018-01-26 NOTE — Progress Notes (Signed)
STROKE TEAM PROGRESS NOTE   HISTORY OF PRESENT ILLNESS (per record) April Mcdonald is an 72 y.o. female who has medical history of HTN, HLD and seizure disorder. She presented to a local Urgent Care center on evening of 6/21 with sudden onset of dizziness, slurred speech that lasted about 5-63min and then she states she simply felt "unwell". Urgent care sent her to [facility] ER for TIA work up. CTH showed no acute findings, but MRI shows. She denies any CP or palpitations or SOB. She had no LOC or vision changes. She said she had a focal right h/a thereafter, but this is now gone and all symptoms have resolved at this time. NIH 0. To note, her seizure disorder has been well controlled on Dilantin and has been seizure free for over 73yrs and at baseline is healthy and active (mRS 0).   SUBJECTIVE (INTERVAL HISTORY) The patient's daughter and the patient's husband were at the bedside. The patient is anxious for discharge. Dr. Pearlean Brownie spoke with them regarding the importance of a TEE and possible loop for further evaluation. The patient also related that she had a recent oral surgical procedure and had been on antibiotics.  Dr. Pearlean Brownie explained that there was always a possibility of endocarditis and a TEE would also be of benefit in this regard. The patient and her family have agreed to stay if the TEE and loop can be scheduled for Monday.    OBJECTIVE Temp:  [97.6 F (36.4 C)-98.2 F (36.8 C)] 97.8 F (36.6 C) (06/23 0328) Pulse Rate:  [61-82] 65 (06/23 0328) Cardiac Rhythm: Heart block (06/23 0700) Resp:  [16-18] 18 (06/23 0328) BP: (131-156)/(49-84) 131/61 (06/23 0328) SpO2:  [95 %-100 %] 97 % (06/23 0328)  CBC:  Recent Labs  Lab 01/24/18 2322  WBC 6.2  HGB 14.3  HCT 40.7  MCV 90.2  PLT 248    Basic Metabolic Panel:  Recent Labs  Lab 01/24/18 2322  NA 142  K 3.5  CL 110  CO2 24  GLUCOSE 113*  BUN 15  CREATININE 0.64  CALCIUM 9.1    Lipid Panel:     Component Value Date/Time    CHOL 178 01/25/2018 0652   TRIG 85 01/25/2018 0652   HDL 51 01/25/2018 0652   CHOLHDL 3.5 01/25/2018 0652   VLDL 17 01/25/2018 0652   LDLCALC 110 (H) 01/25/2018 0652   HgbA1c:  Lab Results  Component Value Date   HGBA1C 5.8 (H) 01/25/2018   Urine Drug Screen:     Component Value Date/Time   LABOPIA NONE DETECTED 01/24/2018 2257   COCAINSCRNUR NONE DETECTED 01/24/2018 2257   LABBENZ NONE DETECTED 01/24/2018 2257   AMPHETMU NONE DETECTED 01/24/2018 2257   THCU NONE DETECTED 01/24/2018 2257   LABBARB (A) 01/24/2018 2257    Result not available. Reagent lot number recalled by manufacturer.    Alcohol Level No results found for: ETH  IMAGING  Ct Head Wo Contrast 01/25/2018 IMPRESSION:  1. No CT evidence for acute intracranial abnormality.  2. 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  Atrophy.    Ct Angio Neck W Or Wo Contrast 01/25/2018 IMPRESSION:  Patent carotid and vertebral arteries. No dissection, aneurysm, or hemodynamically significant stenosis utilizing NASCET criteria. Mild calcific atherosclerosis of carotid bifurcations.    Mr Maxine Glenn Head Wo Contrast 01/25/2018 IMPRESSION:   MRI HEAD  1. Patchy acute ischemic nonhemorrhagic infarcts involving the bilateral cerebellar hemispheres, right greater than left.  2. Small remote right frontal cortical infarct.  3. Air-fluid level within the right maxillary sinus, suggesting acute sinusitis.   MRA HEAD 1. Mild for age intracranial atherosclerotic change without large vessel occlusion or hemodynamically significant stenosis.  2. Predominant fetal type origin of the PCAs with overall diminutive vertebrobasilar system.    Transthoracic Echocardiogram  01/25/2018  Study Conclusions - Left ventricle: The cavity size was normal. There was mild   concentric hypertrophy. Systolic function was normal. The   estimated ejection fraction was in the range of 60% to 65%. Wall   motion was normal; there were no regional wall  motion   abnormalities. - Mitral valve: Mildly calcified annulus. Impressions: - No cardiac source of emboli was indentified.   Bilateral Carotid Dopplers  01/25/2018 Final Interpretation: Right Carotid: Velocities in the right ICA are consistent with a 1-39% stenosis. Left Carotid: Velocities in the left ICA are consistent with a 1-39% stenosis. Vertebrals: Bilateral vertebral arteries demonstrate antegrade flow. Subclavians: Normal flow hemodynamics were seen in bilateral subclavian arteries.    PHYSICAL EXAM Vitals:   01/25/18 1645 01/25/18 1944 01/25/18 2322 01/26/18 0328  BP: (!) 148/68 (!) 149/80 (!) 138/57 131/61  Pulse: 61 63 64 65  Resp: 17 16 18 18   Temp: 98.2 F (36.8 C) 97.9 F (36.6 C) 97.6 F (36.4 C) 97.8 F (36.6 C)  TempSrc: Oral Oral Oral Oral  SpO2: 98% 98% 98% 97%  Weight:      Height:       Pleasant obese elderly Caucasian lady not in distress. . Afebrile. Head is nontraumatic. Neck is supple without bruit.    Cardiac exam no murmur or gallop. Lungs are clear to auscultation. Distal pulses are well felt.  Neurological Exam ;  Awake  Alert oriented x 3. Normal speech and language.eye movements full without nystagmus.fundi were not visualized. Vision acuity and fields appear normal. Hearing is normal. Palatal movements are normal. Face symmetric. Tongue midline. Normal strength, tone, reflexes and coordination. Normal sensation. Gait deferred.         ASSESSMENT/PLAN [name] is a 72 y.o. female with history of hypertension, hyperlipidemia and seizure disorder dizziness, presenting with headache, and slurred speech. She did not receive IV t-PA due to resolution of deficits.   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  Resultant - resolution of deficits.  CT head - No CT evidence for acute intracranial abnormality. 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  MRI head - Patchy acute ischemic nonhemorrhagic  infarcts involving the bilateral cerebellar hemispheres, right greater than left.   MRA head - Mild for age intracranial atherosclerotic change  CTA neck - Patent carotid and vertebral arteries  Carotid Doppler - CTA neck  2D Echo - EF 60 to 65%.  No cardiac source of emboli identified.  LDL - 110  HgbA1c - 5.8  VTE prophylaxis -Lovenox Diet Order           Diet Heart Room service appropriate? Yes; Fluid consistency: Thin  Diet effective now           No antithrombotic prior to admission, now on aspirin 81 mg daily and clopidogrel 75 mg daily  Patient counseled to be compliant with her antithrombotic medications  Ongoing aggressive stroke risk factor management  Therapy recommendations: No follow-up therapies recommended  Disposition:  Pending  Hypertension  Stable . Permissive hypertension (OK if < 220/120) but gradually normalize in 5-7 days . Long-term BP goal normotensive  Hyperlipidemia  Lipid lowering medication PTA: Lipitor 10 mg daily  LDL  110, goal < 70  Current lipid lowering medication: Lipitor 80 mg daily  Continue statin at discharge    Other Stroke Risk Factors  Advanced age  Obesity, Body mass index is 38.99 kg/m., recommend weight loss, diet and exercise as appropriate   Previous stroke by imaging   Other Active Problems  History of seizures -continue Dilantin Lipitor 10   Plan   TEE / Loop tomorrow or as outpatient if schedule is full for Monday.  Aspirin 81 mg daily and Plavix 75 mg daily for 3 weeks then aspirin 81 mg daily alone  F/U Dr Pearlean Brownie 6 - 8 weeks   Hospital day # 0  Delton See PA-C [facility] Pager [phone] 01/26/2018, 12:53 PM I have personally examined this patient, reviewed notes, independently viewed imaging studies, participated in medical decision making and plan of care.ROS completed by me personally and pertinent positives fully documented  I have made any additions or  clarifications directly to the above note. Agree with note above. She presented with transient dizziness and slurred speech secondary to bilateral embolic cerebellar infarcts in etiology indeterminate but given recent oral surgery possibility of endocarditis needs to be considered.Recommend check transesophageal echocardiogram for vegetations and if negative loop recorder for paroxysmal A. Fib. Long discussion the bedside with the patient, daughter as well as with Dr. Blake Divine and answered questions about her care. Greater than 50% time during this 35 minute visit was spent on counseling and coordination of care about her embolic strokes and answered  Delia Heady, MD Medical Director [facility] Pager: [phone] 01/26/2018 12:57 PM   To contact Stroke Continuity provider, please refer to [email or web address]. After hours, contact General Neurology

## 2018-01-26 NOTE — Progress Notes (Signed)
PROGRESS NOTE    April TAMBURRO  Mcdonald:[id number] DOB: 29-Jan-1946 DOA: 01/24/2018 PCP: Roberts Gaudy., MD  Brief Narrative: April Elizarraraz Lanieris a 72 y.o.femalewith medical history significant forhypertension, hyperlipidemia, and seizure disorder, now presenting to the emergency department after anepisode of slurred speech and dizziness. Pt also reports she had an oral abscess and underwent procedure by an oral surgeon and is on amoxicillin to complete the course.   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 Neurology consulted.     Assessment & Plan:   Principal Problem:   Transient speech disturbance Active Problems:   Seizure disorder (HCC)   Hypertension   Hyperlipidemia   Cerebral thrombosis with cerebral infarction   Bilateral ischemic non hemorrhagic infarcts in the cerebellum: currently asymptomatic.  Admitted for stroke work up.  Echocardiogram does not show any embolus.  CT angio of the head and neck does not show any significant stenosis. Mild calcific atherosclerosis of carotid bifurcations. Started her on aspirin, plavix and lipitor.  She will be on aspirin and plavix for 3 weeks followed by aspirin alone.  hgba1c is 5.8 and LDL is 110. PT,OT, SLP evals done.  Appreciate neurology recommendations.  Since she recently had oral surgery, we will get blood cultures and TEE to evaluate for thrombus and vegetations.  ? Loop recorder on discharge.    Seizure disorder: Resume dilantin.    Hypertensive.  Permissive hypertension.       DVT prophylaxis: Lovenox.  Code Status: full code.  Family Communication: family at bedside.  Disposition Plan: pending TEE.   Consultants:   Cardiology for TEE  Neurology Dr Pearlean Brownie.    Procedures: MRI brain.   TEE to be scheduled.    Antimicrobials: amoxicillin for her tooth abscess.    Subjective: WANTS TO shower, denies any complaints.   Objective: Vitals:   01/25/18  1944 01/25/18 2322 01/26/18 0328 01/26/18 0839  BP: (!) 149/80 (!) 138/57 131/61 (!) 143/96  Pulse: 63 64 65 77  Resp: 16 18 18 17   Temp: 97.9 F (36.6 C) 97.6 F (36.4 C) 97.8 F (36.6 C)   TempSrc: Oral Oral Oral   SpO2: 98% 98% 97% 99%  Weight:      Height:        Intake/Output Summary (Last 24 hours) at 01/26/2018 1019 Last data filed at 01/25/2018 2000 Gross per 24 hour  Intake 240 ml  Output -  Net 240 ml   Filed Weights   01/24/18 2235 01/25/18 0300  Weight: 93.4 kg (206 lb) 93.6 kg (206 lb 5.6 oz)    Examination:  General exam: Appears calm and comfortable  Respiratory system: Clear to auscultation. Respiratory effort normal. Cardiovascular system: S1 & S2 heard, RRR. No JVD, murmurs,  No pedal edema. Gastrointestinal system: Abdomen is nondistended, soft and nontender. No organomegaly or masses felt. Normal bowel sounds heard. Central nervous system: Alert and oriented. No focal neurological deficits. Extremities: Symmetric 5 x 5 power. Skin: No rashes, lesions or ulcers Psychiatry: Judgement and insight appear normal. Mood & affect appropriate.     Data Reviewed: I have personally reviewed following labs and imaging studies  CBC: Recent Labs  Lab 01/24/18 2322  WBC 6.2  HGB 14.3  HCT 40.7  MCV 90.2  PLT 248   Basic Metabolic Panel: Recent Labs  Lab 01/24/18 2322  NA 142  K 3.5  CL 110  CO2 24  GLUCOSE 113*  BUN 15  CREATININE 0.64  CALCIUM  9.1   GFR: Estimated Creatinine Clearance: 66.3 mL/min (by C-G formula based on SCr of 0.64 mg/dL). Liver Function Tests: Recent Labs  Lab 01/24/18 2322  AST 27  ALT 30  ALKPHOS 124  BILITOT 0.5  PROT 6.8  ALBUMIN 3.8   No results for input(s): LIPASE, AMYLASE in the last 168 hours. No results for input(s): AMMONIA in the last 168 hours. Coagulation Profile: No results for input(s): INR, PROTIME in the last 168 hours. Cardiac Enzymes: Recent Labs  Lab 01/24/18 2322  TROPONINI <0.03   BNP  (last 3 results) No results for input(s): PROBNP in the last 8760 hours. HbA1C: Recent Labs    01/25/18 0652  HGBA1C 5.8*   CBG: Recent Labs  Lab 01/24/18 2253  GLUCAP 93   Lipid Profile: Recent Labs    01/25/18 0652  CHOL 178  HDL 51  LDLCALC 110*  TRIG 85  CHOLHDL 3.5   Thyroid Function Tests: No results for input(s): TSH, T4TOTAL, FREET4, T3FREE, THYROIDAB in the last 72 hours. Anemia Panel: No results for input(s): VITAMINB12, FOLATE, FERRITIN, TIBC, IRON, RETICCTPCT in the last 72 hours. Sepsis Labs: No results for input(s): PROCALCITON, LATICACIDVEN in the last 168 hours.  No results found for this or any previous visit (from the past 240 hour(s)).       Radiology Studies: Ct Head Wo Contrast  Result Date: 01/25/2018 CLINICAL DATA:  Dizziness and slurred speech [place] EXAM: CT HEAD WITHOUT CONTRAST TECHNIQUE: Contiguous axial images were obtained from the base of the skull through the vertex without intravenous contrast. COMPARISON:  None. FINDINGS: Brain: No acute territorial infarction, hemorrhage or intracranial mass. Ventricles are nonenlarged. 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 Mild atrophy. Vascular: No hyperdense vessels.  Carotid vascular calcification Skull: Normal. Negative for fracture or focal lesion. Sinuses/Orbits: Fluid level in the right maxillary sinus consistent with sinusitis. No acute orbital abnormality Other: None IMPRESSION: 1. No CT evidence for acute intracranial abnormality. 2. 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  Atrophy. Electronically Signed   By: Jasmine PangKim  Fujinaga M.D.   On: 01/25/2018 00:54   Ct Angio Neck W Or Wo Contrast  Result Date: 01/25/2018 CLINICAL DATA:  72 y/o  F; further evaluation of recent stroke. EXAM: CT ANGIOGRAPHY NECK TECHNIQUE: Multidetector CT imaging of the neck was performed using the standard protocol during bolus administration of intravenous contrast. Multiplanar CT image reconstructions and MIPs were  obtained to evaluate the vascular anatomy. Carotid stenosis measurements (when applicable) are obtained utilizing NASCET criteria, using the distal internal carotid diameter as the denominator. CONTRAST:  50 cc Isovue 370 COMPARISON:  01/25/2018 MRI head and MRA head. FINDINGS: Aortic arch: Standard branching. Imaged portion shows no evidence of aneurysm or dissection. No significant stenosis of the major arch vessel origins. Right carotid system: No evidence of dissection, stenosis (50% or greater) or occlusion. Mild calcified plaque of right carotid bifurcation with minimal less than 30% proximal ICA stenosis. Left carotid system: No evidence of dissection, stenosis (50% or greater) or occlusion. Minimal calcified plaque of left carotid bifurcation. Vertebral arteries: Right dominant. No evidence of dissection, stenosis (50% or greater) or occlusion. Skeleton: Cervical spondylosis predominantly at the C5-6 level with disc and facet disease predominantly on the left results in moderate left bony foraminal stenosis. No significant canal stenosis. Other neck: Mild left maxillary sinus mucosal thickening. 10 mm nodule within the left lobe of the thyroid gland. Upper chest: Negative. IMPRESSION: Patent carotid and vertebral arteries. No dissection, aneurysm, or hemodynamically significant stenosis  utilizing NASCET criteria. Mild calcific atherosclerosis of carotid bifurcations. Electronically Signed   By: Mitzi Hansen M.D.   On: 01/25/2018 19:14   Mr Brain Wo Contrast  Result Date: 01/25/2018 CLINICAL DATA:  Initial evaluation for acute TIA, dizziness with slurred speech. EXAM: MRI HEAD WITHOUT CONTRAST MRA HEAD WITHOUT CONTRAST TECHNIQUE: Multiplanar, multiecho pulse sequences of the brain and surrounding structures were obtained without intravenous contrast. Angiographic images of the head were obtained using MRA technique without contrast. COMPARISON:  Prior CT from 01/24/2018. FINDINGS: MRI HEAD  FINDINGS Brain: Cerebral volume within normal limits. Small remote right frontal cortical infarct noted. Patchy multifocal areas of restricted diffusion seen involving the bilateral cerebellar hemispheres, right greater than left, consistent with acute ischemic infarcts. Largest area of infarction measures 16 mm. No associated hemorrhage or mass effect. No other evidence for acute or subacute ischemia. No acute or chronic intracranial hemorrhage. No mass lesion, midline shift or mass effect. No hydrocephalus or extra-axial fluid collection. Pituitary gland within normal limits. Vascular: Major intracranial vascular flow voids maintained. Skull and upper cervical spine: Craniocervical junction normal. Upper cervical spine within normal limits. Normal bone marrow signal intensity. Scalp soft tissues unremarkable. Sinuses/Orbits: Globes and orbital soft tissues within normal limits. Small air-fluid level noted within the right maxillary sinus. Small bilateral mastoid effusions, of doubtful significance. Inner ear structures within normal limits. Other: None. MRA HEAD FINDINGS ANTERIOR CIRCULATION: Distal cervical segments of the internal carotid arteries are patent with antegrade flow. Petrous, cavernous, and supraclinoid segments demonstrate mild atheromatous irregularity but are patent to the termini without hemodynamically significant stenosis. A1 segments, anterior communicating artery common anterior cerebral arteries patent bilaterally. No M1 stenosis or occlusion. Normal MCA bifurcations. Distal MCA branches well perfused and symmetric mild small vessel atheromatous irregularity. POSTERIOR CIRCULATION: Right vertebral artery dominant and patent to the vertebrobasilar junction without stenosis. Hypoplastic left vertebral artery, also patent to the vertebrobasilar junction. Posterior inferior cerebral arteries patent bilaterally. Basilar diminutive but patent to its distal aspect without significant stenosis.  Superior cerebral arteries patent bilaterally. Fetal type origin of the right PCA. Hypoplastic left P1 with prominent left posterior communicating artery. PCAs patent to their distal aspects without stenosis. Mild small vessel atheromatous irregularity. IMPRESSION: MRI HEAD IMPRESSION: 1. Patchy acute ischemic nonhemorrhagic infarcts involving the bilateral cerebellar hemispheres, right greater than left. 2. Small remote right frontal cortical infarct. 3. Air-fluid level within the right maxillary sinus, suggesting acute sinusitis. MRA HEAD IMPRESSION: 1. Mild for age intracranial atherosclerotic change without large vessel occlusion or hemodynamically significant stenosis. 2. Predominant fetal type origin of the PCAs with overall diminutive vertebrobasilar system. Electronically Signed   By: Rise Mu M.D.   On: 01/25/2018 06:56   Mr Maxine Glenn Head Wo Contrast  Result Date: 01/25/2018 CLINICAL DATA:  Initial evaluation for acute TIA, dizziness with slurred speech. EXAM: MRI HEAD WITHOUT CONTRAST MRA HEAD WITHOUT CONTRAST TECHNIQUE: Multiplanar, multiecho pulse sequences of the brain and surrounding structures were obtained without intravenous contrast. Angiographic images of the head were obtained using MRA technique without contrast. COMPARISON:  Prior CT from 01/24/2018. FINDINGS: MRI HEAD FINDINGS Brain: Cerebral volume within normal limits. Small remote right frontal cortical infarct noted. Patchy multifocal areas of restricted diffusion seen involving the bilateral cerebellar hemispheres, right greater than left, consistent with acute ischemic infarcts. Largest area of infarction measures 16 mm. No associated hemorrhage or mass effect. No other evidence for acute or subacute ischemia. No acute or chronic intracranial hemorrhage. No mass lesion, midline shift or mass  effect. No hydrocephalus or extra-axial fluid collection. Pituitary gland within normal limits. Vascular: Major intracranial vascular  flow voids maintained. Skull and upper cervical spine: Craniocervical junction normal. Upper cervical spine within normal limits. Normal bone marrow signal intensity. Scalp soft tissues unremarkable. Sinuses/Orbits: Globes and orbital soft tissues within normal limits. Small air-fluid level noted within the right maxillary sinus. Small bilateral mastoid effusions, of doubtful significance. Inner ear structures within normal limits. Other: None. MRA HEAD FINDINGS ANTERIOR CIRCULATION: Distal cervical segments of the internal carotid arteries are patent with antegrade flow. Petrous, cavernous, and supraclinoid segments demonstrate mild atheromatous irregularity but are patent to the termini without hemodynamically significant stenosis. A1 segments, anterior communicating artery common anterior cerebral arteries patent bilaterally. No M1 stenosis or occlusion. Normal MCA bifurcations. Distal MCA branches well perfused and symmetric mild small vessel atheromatous irregularity. POSTERIOR CIRCULATION: Right vertebral artery dominant and patent to the vertebrobasilar junction without stenosis. Hypoplastic left vertebral artery, also patent to the vertebrobasilar junction. Posterior inferior cerebral arteries patent bilaterally. Basilar diminutive but patent to its distal aspect without significant stenosis. Superior cerebral arteries patent bilaterally. Fetal type origin of the right PCA. Hypoplastic left P1 with prominent left posterior communicating artery. PCAs patent to their distal aspects without stenosis. Mild small vessel atheromatous irregularity. IMPRESSION: MRI HEAD IMPRESSION: 1. Patchy acute ischemic nonhemorrhagic infarcts involving the bilateral cerebellar hemispheres, right greater than left. 2. Small remote right frontal cortical infarct. 3. Air-fluid level within the right maxillary sinus, suggesting acute sinusitis. MRA HEAD IMPRESSION: 1. Mild for age intracranial atherosclerotic change without large  vessel occlusion or hemodynamically significant stenosis. 2. Predominant fetal type origin of the PCAs with overall diminutive vertebrobasilar system. Electronically Signed   By: Rise Mu M.D.   On: 01/25/2018 06:56        Scheduled Meds: . aspirin EC  81 mg Oral Daily  . atorvastatin  80 mg Oral q1800  . clopidogrel  75 mg Oral Daily  . enoxaparin (LOVENOX) injection  40 mg Subcutaneous Q24H  . phenytoin  100 mg Oral TID  . sodium chloride flush  3 mL Intravenous Q12H   Continuous Infusions: . sodium chloride       LOS: 0 days    Time spent: 35 minutes.     Kathlen Mody, MD [facility] Pager [phone]   If 7PM-7AM, please contact night-coverage [email or web address] Password [facility] 01/26/2018, 10:19 AM

## 2018-01-27 ENCOUNTER — Encounter (HOSPITAL_COMMUNITY): Payer: Self-pay | Admitting: *Deleted

## 2018-01-27 ENCOUNTER — Observation Stay (HOSPITAL_BASED_OUTPATIENT_CLINIC_OR_DEPARTMENT_OTHER): Payer: Medicare Other

## 2018-01-27 ENCOUNTER — Ambulatory Visit (HOSPITAL_COMMUNITY): Admit: 2018-01-27 | Payer: Medicare Other | Admitting: Internal Medicine

## 2018-01-27 ENCOUNTER — Encounter (HOSPITAL_COMMUNITY)
Admission: EM | Disposition: A | Discharge status: Home / Self Care | Payer: Self-pay | Source: Home / Self Care | Attending: Emergency Medicine

## 2018-01-27 DIAGNOSIS — E785 Hyperlipidemia, unspecified: Secondary | ICD-10-CM | POA: Diagnosis not present

## 2018-01-27 DIAGNOSIS — R479 Unspecified speech disturbances: Secondary | ICD-10-CM | POA: Diagnosis not present

## 2018-01-27 DIAGNOSIS — I6389 Other cerebral infarction: Secondary | ICD-10-CM

## 2018-01-27 DIAGNOSIS — G40909 Epilepsy, unspecified, not intractable, without status epilepticus: Secondary | ICD-10-CM | POA: Diagnosis not present

## 2018-01-27 DIAGNOSIS — I639 Cerebral infarction, unspecified: Secondary | ICD-10-CM

## 2018-01-27 DIAGNOSIS — I6330 Cerebral infarction due to thrombosis of unspecified cerebral artery: Secondary | ICD-10-CM | POA: Diagnosis not present

## 2018-01-27 HISTORY — PX: TEE WITHOUT CARDIOVERSION: SHX5443

## 2018-01-27 HISTORY — PX: LOOP RECORDER INSERTION: SHX6722

## 2018-01-27 HISTORY — PX: LOOP RECORDER INSERTION: EP1214

## 2018-01-27 SURGERY — LOOP RECORDER INSERTION

## 2018-01-27 SURGERY — ECHOCARDIOGRAM, TRANSESOPHAGEAL
Anesthesia: Moderate Sedation

## 2018-01-27 MED ORDER — FENTANYL CITRATE (PF) 100 MCG/2ML IJ SOLN
INTRAMUSCULAR | Status: DC | PRN
  Administered 2018-01-27 (×2): 12.5 ug via INTRAVENOUS
  Administered 2018-01-27: 25 ug via INTRAVENOUS

## 2018-01-27 MED ORDER — LIDOCAINE-EPINEPHRINE 1 %-1:100000 IJ SOLN
INTRAMUSCULAR | Status: AC
  Filled 2018-01-27: qty 1

## 2018-01-27 MED ORDER — SODIUM CHLORIDE 0.9 % IV SOLN
INTRAVENOUS | Status: DC
  Administered 2018-01-27: 500 mL via INTRAVENOUS

## 2018-01-27 MED ORDER — BUTAMBEN-TETRACAINE-BENZOCAINE 2-2-14 % EX AERO
INHALATION_SPRAY | CUTANEOUS | Status: DC | PRN
  Administered 2018-01-27: 2 via TOPICAL

## 2018-01-27 MED ORDER — LIDOCAINE-EPINEPHRINE 1 %-1:100000 IJ SOLN
INTRAMUSCULAR | Status: DC | PRN
  Administered 2018-01-27: 30 mL

## 2018-01-27 MED ORDER — MIDAZOLAM HCL 5 MG/ML IJ SOLN
INTRAMUSCULAR | Status: AC
  Filled 2018-01-27: qty 2

## 2018-01-27 MED ORDER — FENTANYL CITRATE (PF) 100 MCG/2ML IJ SOLN
INTRAMUSCULAR | Status: AC
  Filled 2018-01-27: qty 2

## 2018-01-27 MED ORDER — MIDAZOLAM HCL 10 MG/2ML IJ SOLN
INTRAMUSCULAR | Status: DC | PRN
  Administered 2018-01-27 (×4): 1 mg via INTRAVENOUS
  Administered 2018-01-27: 2 mg via INTRAVENOUS

## 2018-01-27 SURGICAL SUPPLY — 2 items
LOOP REVEAL LINQSYS (Prosthesis & Implant Heart) ×1 IMPLANT
PACK LOOP INSERTION (CUSTOM PROCEDURE TRAY) ×2 IMPLANT

## 2018-01-27 NOTE — Progress Notes (Signed)
STROKE TEAM PROGRESS NOTE     SUBJECTIVE (INTERVAL HISTORY) The patientis awaiting  the TEE and loop   scheduled for later today. No new complaints.    OBJECTIVE Temp:  [97.9 F (36.6 C)-98.5 F (36.9 C)] 98.5 F (36.9 C) (06/24 1220) Pulse Rate:  [67-71] 71 (06/24 1158) Cardiac Rhythm: Normal sinus rhythm;Heart block (06/24 0946) Resp:  [16-22] 22 (06/24 1220) BP: (129-154)/(57-76) 154/63 (06/24 1220) SpO2:  [95 %-98 %] 98 % (06/24 1220)  CBC:  Recent Labs  Lab 01/24/18 2322  WBC 6.2  HGB 14.3  HCT 40.7  MCV 90.2  PLT 248    Basic Metabolic Panel:  Recent Labs  Lab 01/24/18 2322  NA 142  K 3.5  CL 110  CO2 24  GLUCOSE 113*  BUN 15  CREATININE 0.64  CALCIUM 9.1    Lipid Panel:     Component Value Date/Time   CHOL 178 01/25/2018 0652   TRIG 85 01/25/2018 0652   HDL 51 01/25/2018 0652   CHOLHDL 3.5 01/25/2018 0652   VLDL 17 01/25/2018 0652   LDLCALC 110 (H) 01/25/2018 0652   HgbA1c:  Lab Results  Component Value Date   HGBA1C 5.8 (H) 01/25/2018   Urine Drug Screen:     Component Value Date/Time   LABOPIA NONE DETECTED 01/24/2018 2257   COCAINSCRNUR NONE DETECTED 01/24/2018 2257   LABBENZ NONE DETECTED 01/24/2018 2257   AMPHETMU NONE DETECTED 01/24/2018 2257   THCU NONE DETECTED 01/24/2018 2257   LABBARB (A) 01/24/2018 2257    Result not available. Reagent lot number recalled by manufacturer.    Alcohol Level No results found for: ETH  IMAGING  Ct Head Wo Contrast 01/25/2018 IMPRESSION:  1. No CT evidence for acute intracranial abnormality.  2. 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  Atrophy.    Ct Angio Neck W Or Wo Contrast 01/25/2018 IMPRESSION:  Patent carotid and vertebral arteries. No dissection, aneurysm, or hemodynamically significant stenosis utilizing NASCET criteria. Mild calcific atherosclerosis of carotid bifurcations.    Mr Maxine Glenn Head Wo Contrast 01/25/2018 IMPRESSION:   MRI HEAD  1. Patchy acute ischemic  nonhemorrhagic infarcts involving the bilateral cerebellar hemispheres, right greater than left.  2. Small remote right frontal cortical infarct.  3. Air-fluid level within the right maxillary sinus, suggesting acute sinusitis.   MRA HEAD 1. Mild for age intracranial atherosclerotic change without large vessel occlusion or hemodynamically significant stenosis.  2. Predominant fetal type origin of the PCAs with overall diminutive vertebrobasilar system.    Transthoracic Echocardiogram  01/25/2018  Study Conclusions - Left ventricle: The cavity size was normal. There was mild   concentric hypertrophy. Systolic function was normal. The   estimated ejection fraction was in the range of 60% to 65%. Wall   motion was normal; there were no regional wall motion   abnormalities. - Mitral valve: Mildly calcified annulus. Impressions: - No cardiac source of emboli was indentified.   Bilateral Carotid Dopplers  01/25/2018 Final Interpretation: Right Carotid: Velocities in the right ICA are consistent with a 1-39% stenosis. Left Carotid: Velocities in the left ICA are consistent with a 1-39% stenosis. Vertebrals: Bilateral vertebral arteries demonstrate antegrade flow. Subclavians: Normal flow hemodynamics were seen in bilateral subclavian arteries.    PHYSICAL EXAM Vitals:   01/27/18 0404 01/27/18 0844 01/27/18 1158 01/27/18 1220  BP: (!) 129/57 (!) 148/70 (!) 149/75 (!) 154/63  Pulse: 67 69 71   Resp: 16 20 20  (!) 22  Temp: 98.1 F (36.7 C) 98.2  F (36.8 C) 98.1 F (36.7 C) 98.5 F (36.9 C)  TempSrc: Oral Oral Oral Oral  SpO2: 98% 97% 97% 98%  Weight:      Height:       Pleasant obese elderly Caucasian lady not in distress. . Afebrile. Head is nontraumatic. Neck is supple without bruit.    Cardiac exam no murmur or gallop. Lungs are clear to auscultation. Distal pulses are well felt.  Neurological Exam ;  Awake  Alert oriented x 3. Normal speech and language.eye movements full  without nystagmus.fundi were not visualized. Vision acuity and fields appear normal. Hearing is normal. Palatal movements are normal. Face symmetric. Tongue midline. Normal strength, tone, reflexes and coordination. Normal sensation. Gait deferred.         ASSESSMENT/PLAN Ms. Smitty KnudsenGwyn O Freeland is a 72 y.o. female with history of hypertension, hyperlipidemia and seizure disorder dizziness, presenting with headache, and slurred speech. She did not receive IV t-PA due to resolution of deficits.   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  Resultant - resolution of deficits.  CT head - No CT evidence for acute intracranial abnormality. 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  MRI head - Patchy acute ischemic nonhemorrhagic infarcts involving the bilateral cerebellar hemispheres, right greater than left.   MRA head - Mild for age intracranial atherosclerotic change  CTA neck - Patent carotid and vertebral arteries  Carotid Doppler - CTA neck  2D Echo - EF 60 to 65%.  No cardiac source of emboli identified.  LDL - 110  HgbA1c - 5.8  VTE prophylaxis -Lovenox Diet Order           Diet NPO time specified  Diet effective now          No antithrombotic prior to admission, now on aspirin 81 mg daily and clopidogrel 75 mg daily  Patient counseled to be compliant with her antithrombotic medications  Ongoing aggressive stroke risk factor management  Therapy recommendations: No follow-up therapies recommended  Disposition:  Pending  Hypertension  Stable . Permissive hypertension (OK if < 220/120) but gradually normalize in 5-7 days . Long-term BP goal normotensive  Hyperlipidemia  Lipid lowering medication PTA: Lipitor 10 mg daily  LDL 110, goal < 70  Current lipid lowering medication: Lipitor 80 mg daily  Continue statin at discharge    Other Stroke Risk Factors  Advanced age  Obesity, Body mass index is 38.99 kg/m., recommend weight  loss, diet and exercise as appropriate   Previous stroke by imaging   Other Active Problems  History of seizures -continue Dilantin Lipitor 10   Plan   TEE / Loop tomorrow or as outpatient if schedule is full for Monday.  Aspirin 81 mg daily and Plavix 75 mg daily for 3 weeks then aspirin 81 mg daily alone  F/U Dr Pearlean BrownieSethi 6 - 8 weeks   Hospital day # 0   .Recommend check transesophageal echocardiogram for vegetations and if negative loop recorder for paroxysmal A. Fib. Long discussion ar the bedside with the patient,  and answered questions about her care.    Delia HeadyPramod Shainna Faux, MD Medical Director [facility] Pager: [phone] 01/27/2018 12:21 PM   To contact Stroke Continuity provider, please refer to [email or web address]. After hours, contact General Neurology

## 2018-01-27 NOTE — Progress Notes (Signed)
    [facility] has been requested to perform a transesophageal echocardiogram on 01/27/2018 for bilateral CVA.  After careful review of history and examination, the risks and benefits of transesophageal echocardiogram have been explained including risks of esophageal damage, perforation (1:10,000 risk), bleeding, pharyngeal hematoma as well as other potential complications associated with conscious sedation including aspiration, arrhythmia, respiratory failure and death. Alternatives to treatment were discussed, questions were answered. Patient is willing to proceed.   Patient had new bilateral CVA. Recent oral surgery for abscess. Vital stable. Red blood cell count, platelet normal. TTE normal. Blood culture pending.   [name], PA-C 01/27/2018 10:50 AM

## 2018-01-27 NOTE — Interval H&P Note (Signed)
History and Physical Interval Note:  01/27/2018 12:40 PM  April Mcdonald  has presented today for surgery, with the diagnosis of stroke  The various methods of treatment have been discussed with the patient and family. After consideration of risks, benefits and other options for treatment, the patient has consented to  Procedure(s): TRANSESOPHAGEAL ECHOCARDIOGRAM (TEE) (N/A) as a surgical intervention .  The patient's history has been reviewed, patient examined, no change in status, stable for surgery.  I have reviewed the patient's chart and labs.  Questions were answered to the patient's satisfaction.     Dietrich PatesPaula Ross

## 2018-01-27 NOTE — H&P (View-Only) (Signed)
STROKE TEAM PROGRESS NOTE     SUBJECTIVE (INTERVAL HISTORY) The patientis awaiting  the TEE and loop   scheduled for later today. No new complaints.    OBJECTIVE Temp:  [97.9 F (36.6 C)-98.5 F (36.9 C)] 98.5 F (36.9 C) (06/24 1220) Pulse Rate:  [67-71] 71 (06/24 1158) Cardiac Rhythm: Normal sinus rhythm;Heart block (06/24 0946) Resp:  [16-22] 22 (06/24 1220) BP: (129-154)/(57-76) 154/63 (06/24 1220) SpO2:  [95 %-98 %] 98 % (06/24 1220)  CBC:  Recent Labs  Lab 01/24/18 2322  WBC 6.2  HGB 14.3  HCT 40.7  MCV 90.2  PLT 248    Basic Metabolic Panel:  Recent Labs  Lab 01/24/18 2322  NA 142  K 3.5  CL 110  CO2 24  GLUCOSE 113*  BUN 15  CREATININE 0.64  CALCIUM 9.1    Lipid Panel:     Component Value Date/Time   CHOL 178 01/25/2018 0652   TRIG 85 01/25/2018 0652   HDL 51 01/25/2018 0652   CHOLHDL 3.5 01/25/2018 0652   VLDL 17 01/25/2018 0652   LDLCALC 110 (H) 01/25/2018 0652   HgbA1c:  Lab Results  Component Value Date   HGBA1C 5.8 (H) 01/25/2018   Urine Drug Screen:     Component Value Date/Time   LABOPIA NONE DETECTED 01/24/2018 2257   COCAINSCRNUR NONE DETECTED 01/24/2018 2257   LABBENZ NONE DETECTED 01/24/2018 2257   AMPHETMU NONE DETECTED 01/24/2018 2257   THCU NONE DETECTED 01/24/2018 2257   LABBARB (A) 01/24/2018 2257    Result not available. Reagent lot number recalled by manufacturer.    Alcohol Level No results found for: ETH  IMAGING  Ct Head Wo Contrast 01/25/2018 IMPRESSION:  1. No CT evidence for acute intracranial abnormality.  2. 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  Atrophy.    Ct Angio Neck W Or Wo Contrast 01/25/2018 IMPRESSION:  Patent carotid and vertebral arteries. No dissection, aneurysm, or hemodynamically significant stenosis utilizing NASCET criteria. Mild calcific atherosclerosis of carotid bifurcations.    Mr Mra Head Wo Contrast 01/25/2018 IMPRESSION:   MRI HEAD  1. Patchy acute ischemic  nonhemorrhagic infarcts involving the bilateral cerebellar hemispheres, right greater than left.  2. Small remote right frontal cortical infarct.  3. Air-fluid level within the right maxillary sinus, suggesting acute sinusitis.   MRA HEAD 1. Mild for age intracranial atherosclerotic change without large vessel occlusion or hemodynamically significant stenosis.  2. Predominant fetal type origin of the PCAs with overall diminutive vertebrobasilar system.    Transthoracic Echocardiogram  01/25/2018  Study Conclusions - Left ventricle: The cavity size was normal. There was mild   concentric hypertrophy. Systolic function was normal. The   estimated ejection fraction was in the range of 60% to 65%. Wall   motion was normal; there were no regional wall motion   abnormalities. - Mitral valve: Mildly calcified annulus. Impressions: - No cardiac source of emboli was indentified.   Bilateral Carotid Dopplers  01/25/2018 Final Interpretation: Right Carotid: Velocities in the right ICA are consistent with a 1-39% stenosis. Left Carotid: Velocities in the left ICA are consistent with a 1-39% stenosis. Vertebrals: Bilateral vertebral arteries demonstrate antegrade flow. Subclavians: Normal flow hemodynamics were seen in bilateral subclavian arteries.    PHYSICAL EXAM Vitals:   01/27/18 0404 01/27/18 0844 01/27/18 1158 01/27/18 1220  BP: (!) 129/57 (!) 148/70 (!) 149/75 (!) 154/63  Pulse: 67 69 71   Resp: 16 20 20 (!) 22  Temp: 98.1 F (36.7 C) 98.2   F (36.8 C) 98.1 F (36.7 C) 98.5 F (36.9 C)  TempSrc: Oral Oral Oral Oral  SpO2: 98% 97% 97% 98%  Weight:      Height:       Pleasant obese elderly Caucasian lady not in distress. . Afebrile. Head is nontraumatic. Neck is supple without bruit.    Cardiac exam no murmur or gallop. Lungs are clear to auscultation. Distal pulses are well felt.  Neurological Exam ;  Awake  Alert oriented x 3. Normal speech and language.eye movements full  without nystagmus.fundi were not visualized. Vision acuity and fields appear normal. Hearing is normal. Palatal movements are normal. Face symmetric. Tongue midline. Normal strength, tone, reflexes and coordination. Normal sensation. Gait deferred.         ASSESSMENT/PLAN [name] is a 72 y.o. female with history of hypertension, hyperlipidemia and seizure disorder dizziness, presenting with headache, and slurred speech. She did not receive IV t-PA due to resolution of deficits.   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  Resultant - resolution of deficits.  CT head - No CT evidence for acute intracranial abnormality. 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  MRI head - Patchy acute ischemic nonhemorrhagic infarcts involving the bilateral cerebellar hemispheres, right greater than left.   MRA head - Mild for age intracranial atherosclerotic change  CTA neck - Patent carotid and vertebral arteries  Carotid Doppler - CTA neck  2D Echo - EF 60 to 65%.  No cardiac source of emboli identified.  LDL - 110  HgbA1c - 5.8  VTE prophylaxis -Lovenox Diet Order           Diet NPO time specified  Diet effective now          No antithrombotic prior to admission, now on aspirin 81 mg daily and clopidogrel 75 mg daily  Patient counseled to be compliant with her antithrombotic medications  Ongoing aggressive stroke risk factor management  Therapy recommendations: No follow-up therapies recommended  Disposition:  Pending  Hypertension  Stable . Permissive hypertension (OK if < 220/120) but gradually normalize in 5-7 days . Long-term BP goal normotensive  Hyperlipidemia  Lipid lowering medication PTA: Lipitor 10 mg daily  LDL 110, goal < 70  Current lipid lowering medication: Lipitor 80 mg daily  Continue statin at discharge    Other Stroke Risk Factors  Advanced age  Obesity, Body mass index is 38.99 kg/m., recommend weight  loss, diet and exercise as appropriate   Previous stroke by imaging   Other Active Problems  History of seizures -continue Dilantin Lipitor 10   Plan   TEE / Loop tomorrow or as outpatient if schedule is full for Monday.  Aspirin 81 mg daily and Plavix 75 mg daily for 3 weeks then aspirin 81 mg daily alone  F/U Dr Sethi 6 - 8 weeks   Hospital day # 0   .Recommend check transesophageal echocardiogram for vegetations and if negative loop recorder for paroxysmal A. Fib. Long discussion ar the bedside with the patient,  and answered questions about her care.    Pramod Sethi, MD Medical Director [facility] Pager: [phone] 01/27/2018 12:21 PM   To contact Stroke Continuity provider, please refer to [email or web address]. After hours, contact General Neurology 

## 2018-01-27 NOTE — Plan of Care (Signed)
Adequate for DC.

## 2018-01-27 NOTE — CV Procedure (Signed)
TEE  LA, LA appendage are without masses  PFO present (small) as tested by color doppler and with injection of agitated saline with bubbles seen in L sided chambers  MV normal   TV normal  AV normal  PV normal  LVEF and RVEF are normal   Mild fixed plaquing of thoracic aorta  Full report to follow.

## 2018-01-27 NOTE — Discharge Summary (Signed)
Physician Discharge Summary  April Mcdonald:[id number] DOB: [birth year] DOA: 01/24/2018  PCP: Roberts Gaudy., MD  Admit date: 01/24/2018 Discharge date: 01/27/2018  Admitted From: Home.  Disposition:  Home.  Recommendations for Outpatient Follow-up:  1. Follow up with PCP in 1-2 weeks 2. Please obtain BMP/CBC in one week 3. Please follow up with neurology as recommended.  4. Please follow up with cardiology on July 2nd as scheduled.     Discharge Condition: stable.  CODE STATUS:full code.  Diet recommendation: Heart Healthy   Brief/Interim Summary: April Mcdonald a 72 y.o.femalewith medical history significant forhypertension, hyperlipidemia, and seizure disorder, now presenting to the emergency department after anepisode of slurred speech and dizziness. Pt also reports she had an oral abscess and underwent procedure by an oral surgeon and is on amoxicillin to complete the course.   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 Neurology consulted and recommendations given. She underwent stroke work up for cryptogenic stroke and evaluation by TEE. On discharge a loop recorder was placed and recommended to follow up with cardiology as outpatient.     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  Admitted for stroke work up.  Echocardiogram does not show any embolus.  CT angio of the head and neck does not show any significant stenosis. Mild calcific atherosclerosis of carotid bifurcations. Started her on aspirin, plavix and lipitor changed to 80 mg daily.  She will be on aspirin and plavix for 3 weeks followed by aspirin alone.  hgba1c is 5.8 and LDL is 110. PT,OT, SLP evals done.  Appreciate  neurology recommendations.  Since she recently had oral surgery, we will get blood cultures and TEE to evaluate for thrombus and vegetations.  TEE showed a small PFO, without any thrombus and vegetations.  Venous duplex negative for DVT.  She also underwent loop recorder for evaluation of arrhythmias.  Outpatient follow upw ith neurology and cardiology recommended.    Seizure disorder: Resume dilantin.    Hypertensive.  Well controlled.      Discharge Instructions  Discharge Instructions    Diet - low sodium heart healthy   Complete by:  As directed    Discharge instructions   Complete by:  As directed    Follow up with Neurology and cardiology as recommended.     Allergies as of 01/27/2018   Not on File     Medication List    STOP taking these medications   ibuprofen 100 MG tablet Commonly known as:  ADVIL,MOTRIN   ibuprofen 200 MG tablet Commonly known as:  ADVIL,MOTRIN     TAKE these medications   amoxicillin 500 MG capsule Commonly known as:  AMOXIL Take 500 mg by mouth See admin instructions. Take 2 capsules stat then take 1 capsule every 6 hours What changed:  Another medication with the same name was removed. Continue taking this medication, and follow the directions you see here.   ascorbic acid 500 MG tablet Commonly known as:  VITAMIN C Take 500 mg by mouth daily.   aspirin 81 MG EC tablet Take 1 tablet (81 mg total) by mouth daily.   atorvastatin 80 MG tablet Commonly known as:  LIPITOR Take 1 tablet (80 mg total) by mouth daily at 6 PM. What changed:  medication strength  how much to take  when to take this   clopidogrel 75 MG tablet Commonly known as:  PLAVIX Take 1 tablet (75 mg total) by mouth daily.   diphenoxylate-atropine 2.5-0.025 MG tablet Commonly known as:  LOMOTIL Take by mouth 4 (four) times daily as needed for diarrhea or loose stools.   EYE DROPS OP Place 2-3 drops into both eyes daily as needed (Itchy eyes).    fluticasone 50 MCG/ACT nasal spray Commonly known as:  FLONASE Place 2 sprays into both nostrils daily as needed for congestion.   loratadine 10 MG tablet Commonly known as:  CLARITIN Take 10 mg by mouth daily as needed for allergies.   phenytoin 100 MG ER capsule Commonly known as:  DILANTIN Take 100 mg by mouth daily.   triamterene-hydrochlorothiazide 37.5-25 MG capsule Commonly known as:  DYAZIDE Take 1 each (1 capsule total) by mouth daily. Start taking on:  01/28/2018 What changed:  Another medication with the same name was removed. Continue taking this medication, and follow the directions you see here.   vitamin B-12 500 MCG tablet Commonly known as:  CYANOCOBALAMIN Take 500 mcg by mouth daily.   Vitamin D3 1000 units Caps Take 2,000 Units by mouth daily.      Follow-up Information    Roberts Gaudy., MD. Schedule an appointment as soon as possible for a visit in 1 week(s).   Specialty:  Internal Medicine Contact information: [address] [place] [address] [phone]        [facility] [place] [facility] Follow up on 02/04/2018.   Specialty:  Cardiology Why:  at [facility] information: [address], [address] [place] [address] [phone]       Micki Riley, MD. Schedule an appointment as soon as possible for a visit in 6 week(s).   Specialties:  Neurology, Radiology Contact information: [address] [place] [address] [phone]          Not on File  Consultations:  Neurology Dr Pearlean Brownie  Cardiology.    Procedures/Studies: Ct Head Wo Contrast  Result Date: 01/25/2018 CLINICAL DATA:  Dizziness and slurred speech [place] EXAM: CT HEAD WITHOUT CONTRAST TECHNIQUE: Contiguous axial images were obtained from the base of the skull through the vertex without intravenous contrast. COMPARISON:  None. FINDINGS: Brain: No acute territorial infarction, hemorrhage or intracranial mass.  Ventricles are nonenlarged. 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 Mild atrophy. Vascular: No hyperdense vessels.  Carotid vascular calcification Skull: Normal. Negative for fracture or focal lesion. Sinuses/Orbits: Fluid level in the right maxillary sinus consistent with sinusitis. No acute orbital abnormality Other: None IMPRESSION: 1. No CT evidence for acute intracranial abnormality. 2. 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  Atrophy. Electronically Signed   By: Jasmine Pang M.D.   On: 01/25/2018 00:54   Ct Angio Neck W Or Wo Contrast  Result Date: 01/25/2018 CLINICAL DATA:  72 y/o  F; further evaluation of recent stroke. EXAM: CT ANGIOGRAPHY NECK TECHNIQUE: Multidetector CT imaging of the neck was performed using the standard protocol during bolus administration of intravenous contrast. Multiplanar CT image reconstructions and MIPs were obtained to evaluate the vascular anatomy. Carotid stenosis measurements (when applicable) are obtained utilizing NASCET criteria, using the distal internal carotid diameter as the denominator. CONTRAST:  50 cc Isovue 370 COMPARISON:  01/25/2018 MRI head and MRA head. FINDINGS: Aortic arch: Standard branching. Imaged portion shows no evidence of aneurysm or dissection. No significant stenosis of  the major arch vessel origins. Right carotid system: No evidence of dissection, stenosis (50% or greater) or occlusion. Mild calcified plaque of right carotid bifurcation with minimal less than 30% proximal ICA stenosis. Left carotid system: No evidence of dissection, stenosis (50% or greater) or occlusion. Minimal calcified plaque of left carotid bifurcation. Vertebral arteries: Right dominant. No evidence of dissection, stenosis (50% or greater) or occlusion. Skeleton: Cervical spondylosis predominantly at the C5-6 level with disc and facet disease predominantly on the left results in moderate left bony foraminal stenosis. No significant canal stenosis. Other neck: Mild left  maxillary sinus mucosal thickening. 10 mm nodule within the left lobe of the thyroid gland. Upper chest: Negative. IMPRESSION: Patent carotid and vertebral arteries. No dissection, aneurysm, or hemodynamically significant stenosis utilizing NASCET criteria. Mild calcific atherosclerosis of carotid bifurcations. Electronically Signed   By: Mitzi Hansen M.D.   On: 01/25/2018 19:14   Mr Brain Wo Contrast  Result Date: 01/25/2018 CLINICAL DATA:  Initial evaluation for acute TIA, dizziness with slurred speech. EXAM: MRI HEAD WITHOUT CONTRAST MRA HEAD WITHOUT CONTRAST TECHNIQUE: Multiplanar, multiecho pulse sequences of the brain and surrounding structures were obtained without intravenous contrast. Angiographic images of the head were obtained using MRA technique without contrast. COMPARISON:  Prior CT from 01/24/2018. FINDINGS: MRI HEAD FINDINGS Brain: Cerebral volume within normal limits. Small remote right frontal cortical infarct noted. Patchy multifocal areas of restricted diffusion seen involving the bilateral cerebellar hemispheres, right greater than left, consistent with acute ischemic infarcts. Largest area of infarction measures 16 mm. No associated hemorrhage or mass effect. No other evidence for acute or subacute ischemia. No acute or chronic intracranial hemorrhage. No mass lesion, midline shift or mass effect. No hydrocephalus or extra-axial fluid collection. Pituitary gland within normal limits. Vascular: Major intracranial vascular flow voids maintained. Skull and upper cervical spine: Craniocervical junction normal. Upper cervical spine within normal limits. Normal bone marrow signal intensity. Scalp soft tissues unremarkable. Sinuses/Orbits: Globes and orbital soft tissues within normal limits. Small air-fluid level noted within the right maxillary sinus. Small bilateral mastoid effusions, of doubtful significance. Inner ear structures within normal limits. Other: None. MRA HEAD  FINDINGS ANTERIOR CIRCULATION: Distal cervical segments of the internal carotid arteries are patent with antegrade flow. Petrous, cavernous, and supraclinoid segments demonstrate mild atheromatous irregularity but are patent to the termini without hemodynamically significant stenosis. A1 segments, anterior communicating artery common anterior cerebral arteries patent bilaterally. No M1 stenosis or occlusion. Normal MCA bifurcations. Distal MCA branches well perfused and symmetric mild small vessel atheromatous irregularity. POSTERIOR CIRCULATION: Right vertebral artery dominant and patent to the vertebrobasilar junction without stenosis. Hypoplastic left vertebral artery, also patent to the vertebrobasilar junction. Posterior inferior cerebral arteries patent bilaterally. Basilar diminutive but patent to its distal aspect without significant stenosis. Superior cerebral arteries patent bilaterally. Fetal type origin of the right PCA. Hypoplastic left P1 with prominent left posterior communicating artery. PCAs patent to their distal aspects without stenosis. Mild small vessel atheromatous irregularity. IMPRESSION: MRI HEAD IMPRESSION: 1. Patchy acute ischemic nonhemorrhagic infarcts involving the bilateral cerebellar hemispheres, right greater than left. 2. Small remote right frontal cortical infarct. 3. Air-fluid level within the right maxillary sinus, suggesting acute sinusitis. MRA HEAD IMPRESSION: 1. Mild for age intracranial atherosclerotic change without large vessel occlusion or hemodynamically significant stenosis. 2. Predominant fetal type origin of the PCAs with overall diminutive vertebrobasilar system. Electronically Signed   By: Rise Mu M.D.   On: 01/25/2018 06:56   Mr Maxine Glenn Head Wo Contrast  Result Date: 01/25/2018 CLINICAL DATA:  Initial evaluation for acute TIA, dizziness with slurred speech. EXAM: MRI HEAD WITHOUT CONTRAST MRA HEAD WITHOUT CONTRAST TECHNIQUE: Multiplanar, multiecho  pulse sequences of the brain and surrounding structures were obtained without intravenous contrast. Angiographic images of the head were obtained using MRA technique without contrast. COMPARISON:  Prior CT from 01/24/2018. FINDINGS: MRI HEAD FINDINGS Brain: Cerebral volume within normal limits. Small remote right frontal cortical infarct noted. Patchy multifocal areas of restricted diffusion seen involving the bilateral cerebellar hemispheres, right greater than left, consistent with acute ischemic infarcts. Largest area of infarction measures 16 mm. No associated hemorrhage or mass effect. No other evidence for acute or subacute ischemia. No acute or chronic intracranial hemorrhage. No mass lesion, midline shift or mass effect. No hydrocephalus or extra-axial fluid collection. Pituitary gland within normal limits. Vascular: Major intracranial vascular flow voids maintained. Skull and upper cervical spine: Craniocervical junction normal. Upper cervical spine within normal limits. Normal bone marrow signal intensity. Scalp soft tissues unremarkable. Sinuses/Orbits: Globes and orbital soft tissues within normal limits. Small air-fluid level noted within the right maxillary sinus. Small bilateral mastoid effusions, of doubtful significance. Inner ear structures within normal limits. Other: None. MRA HEAD FINDINGS ANTERIOR CIRCULATION: Distal cervical segments of the internal carotid arteries are patent with antegrade flow. Petrous, cavernous, and supraclinoid segments demonstrate mild atheromatous irregularity but are patent to the termini without hemodynamically significant stenosis. A1 segments, anterior communicating artery common anterior cerebral arteries patent bilaterally. No M1 stenosis or occlusion. Normal MCA bifurcations. Distal MCA branches well perfused and symmetric mild small vessel atheromatous irregularity. POSTERIOR CIRCULATION: Right vertebral artery dominant and patent to the vertebrobasilar  junction without stenosis. Hypoplastic left vertebral artery, also patent to the vertebrobasilar junction. Posterior inferior cerebral arteries patent bilaterally. Basilar diminutive but patent to its distal aspect without significant stenosis. Superior cerebral arteries patent bilaterally. Fetal type origin of the right PCA. Hypoplastic left P1 with prominent left posterior communicating artery. PCAs patent to their distal aspects without stenosis. Mild small vessel atheromatous irregularity. IMPRESSION: MRI HEAD IMPRESSION: 1. Patchy acute ischemic nonhemorrhagic infarcts involving the bilateral cerebellar hemispheres, right greater than left. 2. Small remote right frontal cortical infarct. 3. Air-fluid level within the right maxillary sinus, suggesting acute sinusitis. MRA HEAD IMPRESSION: 1. Mild for age intracranial atherosclerotic change without large vessel occlusion or hemodynamically significant stenosis. 2. Predominant fetal type origin of the PCAs with overall diminutive vertebrobasilar system. Electronically Signed   By: Rise Mu M.D.   On: 01/25/2018 06:56       Subjective: Reports feeling fine. No complaints.   Discharge Exam: Vitals:   01/27/18 1525 01/27/18 1704  BP:  130/63  Pulse:  75  Resp:  20  Temp:  98.4 F (36.9 C)  SpO2: 100% 98%   Vitals:   01/27/18 1515 01/27/18 1520 01/27/18 1525 01/27/18 1704  BP:    130/63  Pulse:    75  Resp:    20  Temp:    98.4 F (36.9 C)  TempSrc:    Oral  SpO2: 100% 99% 100% 98%  Weight:      Height:        General: Pt is alert, awake, not in acute distress Cardiovascular: RRR, S1/S2 +, no rubs, no gallops Respiratory: CTA bilaterally, no wheezing, no rhonchi Abdominal: Soft, NT, ND, bowel sounds + Extremities: no edema, no cyanosis    The results of significant diagnostics from this hospitalization (including imaging, microbiology, ancillary and laboratory) are listed  below for reference.      Microbiology: Recent Results (from the past 240 hour(s))  Culture, blood (Routine X 2) w Reflex to ID Panel     Status: None (Preliminary result)   Collection Time: 01/26/18 12:05 PM  Result Value Ref Range Status   Specimen Description BLOOD LEFT ANTECUBITAL  Final   Special Requests   Final    BOTTLES DRAWN AEROBIC AND ANAEROBIC Blood Culture adequate volume   Culture   Final    NO GROWTH 1 DAY Performed at [facility], [address], [place], [place] [address]    Report Status PENDING  Incomplete  Culture, blood (Routine X 2) w Reflex to ID Panel     Status: None (Preliminary result)   Collection Time: 01/26/18 12:08 PM  Result Value Ref Range Status   Specimen Description BLOOD RIGHT HAND  Final   Special Requests   Final    BOTTLES DRAWN AEROBIC AND ANAEROBIC Blood Culture results may not be optimal due to an inadequate volume of blood received in culture bottles   Culture   Final    NO GROWTH 1 DAY Performed at [facility], [address] [place], [place] [address]    Report Status PENDING  Incomplete     Labs: BNP (last 3 results) No results for input(s): BNP in the last 8760 hours. Basic Metabolic Panel: Recent Labs  Lab 01/24/18 2322  NA 142  K 3.5  CL 110  CO2 24  GLUCOSE 113*  BUN 15  CREATININE 0.64  CALCIUM 9.1   Liver Function Tests: Recent Labs  Lab 01/24/18 2322  AST 27  ALT 30  ALKPHOS 124  BILITOT 0.5  PROT 6.8  ALBUMIN 3.8   No results for input(s): LIPASE, AMYLASE in the last 168 hours. No results for input(s): AMMONIA in the last 168 hours. CBC: Recent Labs  Lab 01/24/18 2322  WBC 6.2  HGB 14.3  HCT 40.7  MCV 90.2  PLT 248   Cardiac Enzymes: Recent Labs  Lab 01/24/18 2322  TROPONINI <0.03   BNP: Invalid input(s): POCBNP CBG: Recent Labs  Lab 01/24/18 2253  GLUCAP 93   D-Dimer No results for input(s): DDIMER in the last 72 hours. Hgb A1c Recent Labs    01/25/18 0652  HGBA1C 5.8*   Lipid  Profile Recent Labs    01/25/18 0652  CHOL 178  HDL 51  LDLCALC 110*  TRIG 85  CHOLHDL 3.5   Thyroid function studies No results for input(s): TSH, T4TOTAL, T3FREE, THYROIDAB in the last 72 hours.  Invalid input(s): FREET3 Anemia work up No results for input(s): VITAMINB12, FOLATE, FERRITIN, TIBC, IRON, RETICCTPCT in the last 72 hours. Urinalysis No results found for: COLORURINE, APPEARANCEUR, LABSPEC, PHURINE, GLUCOSEU, HGBUR, BILIRUBINUR, KETONESUR, PROTEINUR, UROBILINOGEN, NITRITE, LEUKOCYTESUR Sepsis Labs Invalid input(s): PROCALCITONIN,  WBC,  LACTICIDVEN Microbiology Recent Results (from the past 240 hour(s))  Culture, blood (Routine X 2) w Reflex to ID Panel     Status: None (Preliminary result)   Collection Time: 01/26/18 12:05 PM  Result Value Ref Range Status   Specimen Description BLOOD LEFT ANTECUBITAL  Final   Special Requests   Final    BOTTLES DRAWN AEROBIC AND ANAEROBIC Blood Culture adequate volume   Culture   Final    NO GROWTH 1 DAY Performed at [facility], [address] [place], [place] [address]    Report Status PENDING  Incomplete  Culture, blood (Routine X 2) w  Reflex to ID Panel     Status: None (Preliminary result)   Collection Time: 01/26/18 12:08 PM  Result Value Ref Range Status   Specimen Description BLOOD RIGHT HAND  Final   Special Requests   Final    BOTTLES DRAWN AEROBIC AND ANAEROBIC Blood Culture results may not be optimal due to an inadequate volume of blood received in culture bottles   Culture   Final    NO GROWTH 1 DAY Performed at [facility], [address] [place], [place] [id number]    Report Status PENDING  Incomplete     Time coordinating discharge: 32 minutes  SIGNED:   Kathlen ModyVijaya Ciearra Rufo, MD  [facility] 01/27/2018, 6:00 PM Pager   If 7PM-7AM, please contact night-coverage [email or web address] Password [email or web address]

## 2018-01-27 NOTE — Plan of Care (Signed)
Ready for discharge

## 2018-01-27 NOTE — Progress Notes (Signed)
VASCULAR LAB PRELIMINARY  PRELIMINARY  PRELIMINARY  PRELIMINARY  Bilateral lower extremity venous duplex completed.    Preliminary report:  There is no DVT or SVT noted in the bilateral lower extremities.   Ayla Dunigan, RVT 01/27/2018, 4:03 PM

## 2018-01-27 NOTE — Consult Note (Signed)
ELECTROPHYSIOLOGY CONSULT NOTE  Patient ID: April Mcdonald MRN: [id number], DOB/AGE: [birth year]   Admit date: 01/24/2018 Date of Consult: 01/27/2018  Primary Physician: Roberts Gaudy., MD Primary Cardiologist: new to [facility] Reason for Consultation: Cryptogenic stroke; recommendations regarding Implantable Loop Recorder  History of Present Illness EP has been asked to evaluate Smitty Knudsen for placement of an implantable loop recorder to monitor for atrial fibrillation by Dr Pearlean Brownie.  The patient was admitted on 01/24/2018 with dizziness and slurred speech.  Imaging demonstrated bilateral cerebellar infarcts felt to be embolic 2/2 unknown source.  she has undergone workup for stroke including echocardiogram and carotid dopplers.  The patient has been monitored on telemetry which has demonstrated sinus rhythm with no arrhythmias.  Inpatient stroke work-up is to be completed with a TEE.   Echocardiogram this admission demonstrated normal LVEF, no RWMA, no LA enlargement.  Lab work is reviewed.  Prior to admission, the patient denies chest pain, shortness of breath, dizziness, palpitations, or syncope.  They are recovering from their stroke with plans to return home at discharge.   Past Medical History:  Diagnosis Date  . Hypertension   . Seizures (HCC)      Surgical History: History reviewed. No pertinent surgical history.   Medications Prior to Admission  Medication Sig Dispense Refill Last Dose  . amoxicillin (AMOXIL) 500 MG capsule Take 500 mg by mouth See admin instructions. Take 2 capsules stat then take 1 capsule every 6 hours   01/24/2018 at Unknown time  . ascorbic acid (VITAMIN C) 500 MG tablet Take 500 mg by mouth daily.   01/24/2018 at Unknown time  . atorvastatin (LIPITOR) 10 MG tablet Take 10 mg by mouth daily.  2 01/24/2018 at Unknown time  . Carboxymethylcellulose Sodium (EYE DROPS OP) Place 2-3 drops into both eyes daily as needed (Itchy eyes).   Unk  . Cholecalciferol  (VITAMIN D3) 1000 units CAPS Take 2,000 Units by mouth daily.   01/24/2018 at Unknown time  . diphenoxylate-atropine (LOMOTIL) 2.5-0.025 MG tablet Take by mouth 4 (four) times daily as needed for diarrhea or loose stools.   01/25/2018 at Unknown time  . fluticasone (FLONASE) 50 MCG/ACT nasal spray Place 2 sprays into both nostrils daily as needed for congestion.   Unk  . ibuprofen (ADVIL,MOTRIN) 200 MG tablet Take 400 mg by mouth every 6 (six) hours as needed for fever or moderate pain.   Unk  . loratadine (CLARITIN) 10 MG tablet Take 10 mg by mouth daily as needed for allergies.   Unk  . phenytoin (DILANTIN) 100 MG ER capsule Take 100 mg by mouth daily.    01/24/2018 at Unknown time  . triamterene-hydrochlorothiazide (MAXZIDE-25) 37.5-25 MG tablet Take 1 tablet by mouth daily.   01/24/2018 at Unknown time  . vitamin B-12 (CYANOCOBALAMIN) 500 MCG tablet Take 500 mcg by mouth daily.   01/24/2018 at Unknown time  . [DISCONTINUED] amoxicillin (AMOXIL) 500 MG capsule Take 500 mg by mouth See admin instructions. Take 2 capsules stat then take 1 capsule every 6 hours   01/24/2018 at Unknown time  . [DISCONTINUED] ibuprofen (ADVIL,MOTRIN) 100 MG tablet Take 100 mg by mouth every 6 (six) hours as needed for fever.     . [DISCONTINUED] triamterene-hydrochlorothiazide (DYAZIDE) 37.5-25 MG capsule Take 1 capsule by mouth daily.       Inpatient Medications:  . [MAR Hold] aspirin EC  81 mg Oral Daily  . [MAR Hold] atorvastatin  80 mg Oral q1800  . [  MAR Hold] clopidogrel  75 mg Oral Daily  . [MAR Hold] enoxaparin (LOVENOX) injection  40 mg Subcutaneous Q24H  . [MAR Hold] phenytoin  100 mg Oral TID  . [MAR Hold] sodium chloride flush  3 mL Intravenous Q12H    Allergies: Not on File  Social History   Socioeconomic History  . Marital status: Married    Spouse name: Not on file  . Number of children: Not on file  . Years of education: Not on file  . Highest education level: Not on file  Occupational History    . Not on file  Social Needs  . Financial resource strain: Not on file  . Food insecurity:    Worry: Not on file    Inability: Not on file  . Transportation needs:    Medical: Not on file    Non-medical: Not on file  Tobacco Use  . Smoking status: Never Smoker  . Smokeless tobacco: Never Used  Substance and Sexual Activity  . Alcohol use: Never    Frequency: Never  . Drug use: Never  . Sexual activity: Not on file  Lifestyle  . Physical activity:    Days per week: Not on file    Minutes per session: Not on file  . Stress: Not on file  Relationships  . Social connections:    Talks on phone: Not on file    Gets together: Not on file    Attends religious service: Not on file    Active member of club or organization: Not on file    Attends meetings of clubs or organizations: Not on file    Relationship status: Not on file  . Intimate partner violence:    Fear of current or ex partner: Not on file    Emotionally abused: Not on file    Physically abused: Not on file    Forced sexual activity: Not on file  Other Topics Concern  . Not on file  Social History Narrative  . Not on file     Family History  Problem Relation Age of Onset  . Breast cancer Cousin       Review of Systems: All other systems reviewed and are otherwise negative except as noted above.  Physical Exam: Vitals:   01/27/18 0404 01/27/18 0844 01/27/18 1158 01/27/18 1220  BP: (!) 129/57 (!) 148/70 (!) 149/75 (!) 154/63  Pulse: 67 69 71   Resp: 16 20 20  (!) 22  Temp: 98.1 F (36.7 C) 98.2 F (36.8 C) 98.1 F (36.7 C) 98.5 F (36.9 C)  TempSrc: Oral Oral Oral Oral  SpO2: 98% 97% 97% 98%  Weight:      Height:        GEN- The patient is well appearing, alert and oriented x 3 today.   Head- normocephalic, atraumatic Eyes-  Sclera clear, conjunctiva pink Ears- hearing intact Oropharynx- clear Neck- supple Lungs- Clear to ausculation bilaterally, normal work of breathing Heart- Regular rate  and rhythm  GI- soft, NT, ND, + BS Extremities- no clubbing, cyanosis, or edema MS- no significant deformity or atrophy Skin- no rash or lesion Psych- euthymic mood, full affect   Labs:   Lab Results  Component Value Date   WBC 6.2 01/24/2018   HGB 14.3 01/24/2018   HCT 40.7 01/24/2018   MCV 90.2 01/24/2018   PLT 248 01/24/2018    Recent Labs  Lab 01/24/18 2322  NA 142  K 3.5  CL 110  CO2 24  BUN 15  CREATININE 0.64  CALCIUM 9.1  PROT 6.8  BILITOT 0.5  ALKPHOS 124  ALT 30  AST 27  GLUCOSE 113*     Radiology/Studies: Ct Head Wo Contrast  Result Date: 01/25/2018 CLINICAL DATA:  Dizziness and slurred speech [place] EXAM: CT HEAD WITHOUT CONTRAST TECHNIQUE: Contiguous axial images were obtained from the base of the skull through the vertex without intravenous contrast. COMPARISON:  None. FINDINGS: Brain: No acute territorial infarction, hemorrhage or intracranial mass. Ventricles are nonenlarged. 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 Mild atrophy. Vascular: No hyperdense vessels.  Carotid vascular calcification Skull: Normal. Negative for fracture or focal lesion. Sinuses/Orbits: Fluid level in the right maxillary sinus consistent with sinusitis. No acute orbital abnormality Other: None IMPRESSION: 1. No CT evidence for acute intracranial abnormality. 2. 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  Atrophy. Electronically Signed   By: Jasmine Pang M.D.   On: 01/25/2018 00:54   Ct Angio Neck W Or Wo Contrast  Result Date: 01/25/2018 CLINICAL DATA:  72 y/o  F; further evaluation of recent stroke. EXAM: CT ANGIOGRAPHY NECK TECHNIQUE: Multidetector CT imaging of the neck was performed using the standard protocol during bolus administration of intravenous contrast. Multiplanar CT image reconstructions and MIPs were obtained to evaluate the vascular anatomy. Carotid stenosis measurements (when applicable) are obtained utilizing NASCET criteria, using the distal internal carotid diameter  as the denominator. CONTRAST:  50 cc Isovue 370 COMPARISON:  01/25/2018 MRI head and MRA head. FINDINGS: Aortic arch: Standard branching. Imaged portion shows no evidence of aneurysm or dissection. No significant stenosis of the major arch vessel origins. Right carotid system: No evidence of dissection, stenosis (50% or greater) or occlusion. Mild calcified plaque of right carotid bifurcation with minimal less than 30% proximal ICA stenosis. Left carotid system: No evidence of dissection, stenosis (50% or greater) or occlusion. Minimal calcified plaque of left carotid bifurcation. Vertebral arteries: Right dominant. No evidence of dissection, stenosis (50% or greater) or occlusion. Skeleton: Cervical spondylosis predominantly at the C5-6 level with disc and facet disease predominantly on the left results in moderate left bony foraminal stenosis. No significant canal stenosis. Other neck: Mild left maxillary sinus mucosal thickening. 10 mm nodule within the left lobe of the thyroid gland. Upper chest: Negative. IMPRESSION: Patent carotid and vertebral arteries. No dissection, aneurysm, or hemodynamically significant stenosis utilizing NASCET criteria. Mild calcific atherosclerosis of carotid bifurcations. Electronically Signed   By: Mitzi Hansen M.D.   On: 01/25/2018 19:14   Mr Brain Wo Contrast  Result Date: 01/25/2018 CLINICAL DATA:  Initial evaluation for acute TIA, dizziness with slurred speech. EXAM: MRI HEAD WITHOUT CONTRAST MRA HEAD WITHOUT CONTRAST TECHNIQUE: Multiplanar, multiecho pulse sequences of the brain and surrounding structures were obtained without intravenous contrast. Angiographic images of the head were obtained using MRA technique without contrast. COMPARISON:  Prior CT from 01/24/2018. FINDINGS: MRI HEAD FINDINGS Brain: Cerebral volume within normal limits. Small remote right frontal cortical infarct noted. Patchy multifocal areas of restricted diffusion seen involving the  bilateral cerebellar hemispheres, right greater than left, consistent with acute ischemic infarcts. Largest area of infarction measures 16 mm. No associated hemorrhage or mass effect. No other evidence for acute or subacute ischemia. No acute or chronic intracranial hemorrhage. No mass lesion, midline shift or mass effect. No hydrocephalus or extra-axial fluid collection. Pituitary gland within normal limits. Vascular: Major intracranial vascular flow voids maintained. Skull and upper cervical spine: Craniocervical junction normal. Upper cervical spine within normal limits. Normal bone marrow signal intensity. Scalp soft tissues  unremarkable. Sinuses/Orbits: Globes and orbital soft tissues within normal limits. Small air-fluid level noted within the right maxillary sinus. Small bilateral mastoid effusions, of doubtful significance. Inner ear structures within normal limits. Other: None. MRA HEAD FINDINGS ANTERIOR CIRCULATION: Distal cervical segments of the internal carotid arteries are patent with antegrade flow. Petrous, cavernous, and supraclinoid segments demonstrate mild atheromatous irregularity but are patent to the termini without hemodynamically significant stenosis. A1 segments, anterior communicating artery common anterior cerebral arteries patent bilaterally. No M1 stenosis or occlusion. Normal MCA bifurcations. Distal MCA branches well perfused and symmetric mild small vessel atheromatous irregularity. POSTERIOR CIRCULATION: Right vertebral artery dominant and patent to the vertebrobasilar junction without stenosis. Hypoplastic left vertebral artery, also patent to the vertebrobasilar junction. Posterior inferior cerebral arteries patent bilaterally. Basilar diminutive but patent to its distal aspect without significant stenosis. Superior cerebral arteries patent bilaterally. Fetal type origin of the right PCA. Hypoplastic left P1 with prominent left posterior communicating artery. PCAs patent to their  distal aspects without stenosis. Mild small vessel atheromatous irregularity. IMPRESSION: MRI HEAD IMPRESSION: 1. Patchy acute ischemic nonhemorrhagic infarcts involving the bilateral cerebellar hemispheres, right greater than left. 2. Small remote right frontal cortical infarct. 3. Air-fluid level within the right maxillary sinus, suggesting acute sinusitis. MRA HEAD IMPRESSION: 1. Mild for age intracranial atherosclerotic change without large vessel occlusion or hemodynamically significant stenosis. 2. Predominant fetal type origin of the PCAs with overall diminutive vertebrobasilar system. Electronically Signed   By: Rise Mu M.D.   On: 01/25/2018 06:56   Mr Maxine Glenn Head Wo Contrast  Result Date: 01/25/2018 CLINICAL DATA:  Initial evaluation for acute TIA, dizziness with slurred speech. EXAM: MRI HEAD WITHOUT CONTRAST MRA HEAD WITHOUT CONTRAST TECHNIQUE: Multiplanar, multiecho pulse sequences of the brain and surrounding structures were obtained without intravenous contrast. Angiographic images of the head were obtained using MRA technique without contrast. COMPARISON:  Prior CT from 01/24/2018. FINDINGS: MRI HEAD FINDINGS Brain: Cerebral volume within normal limits. Small remote right frontal cortical infarct noted. Patchy multifocal areas of restricted diffusion seen involving the bilateral cerebellar hemispheres, right greater than left, consistent with acute ischemic infarcts. Largest area of infarction measures 16 mm. No associated hemorrhage or mass effect. No other evidence for acute or subacute ischemia. No acute or chronic intracranial hemorrhage. No mass lesion, midline shift or mass effect. No hydrocephalus or extra-axial fluid collection. Pituitary gland within normal limits. Vascular: Major intracranial vascular flow voids maintained. Skull and upper cervical spine: Craniocervical junction normal. Upper cervical spine within normal limits. Normal bone marrow signal intensity. Scalp soft  tissues unremarkable. Sinuses/Orbits: Globes and orbital soft tissues within normal limits. Small air-fluid level noted within the right maxillary sinus. Small bilateral mastoid effusions, of doubtful significance. Inner ear structures within normal limits. Other: None. MRA HEAD FINDINGS ANTERIOR CIRCULATION: Distal cervical segments of the internal carotid arteries are patent with antegrade flow. Petrous, cavernous, and supraclinoid segments demonstrate mild atheromatous irregularity but are patent to the termini without hemodynamically significant stenosis. A1 segments, anterior communicating artery common anterior cerebral arteries patent bilaterally. No M1 stenosis or occlusion. Normal MCA bifurcations. Distal MCA branches well perfused and symmetric mild small vessel atheromatous irregularity. POSTERIOR CIRCULATION: Right vertebral artery dominant and patent to the vertebrobasilar junction without stenosis. Hypoplastic left vertebral artery, also patent to the vertebrobasilar junction. Posterior inferior cerebral arteries patent bilaterally. Basilar diminutive but patent to its distal aspect without significant stenosis. Superior cerebral arteries patent bilaterally. Fetal type origin of the right PCA. Hypoplastic left P1 with prominent  left posterior communicating artery. PCAs patent to their distal aspects without stenosis. Mild small vessel atheromatous irregularity. IMPRESSION: MRI HEAD IMPRESSION: 1. Patchy acute ischemic nonhemorrhagic infarcts involving the bilateral cerebellar hemispheres, right greater than left. 2. Small remote right frontal cortical infarct. 3. Air-fluid level within the right maxillary sinus, suggesting acute sinusitis. MRA HEAD IMPRESSION: 1. Mild for age intracranial atherosclerotic change without large vessel occlusion or hemodynamically significant stenosis. 2. Predominant fetal type origin of the PCAs with overall diminutive vertebrobasilar system. Electronically Signed   By:  Rise MuBenjamin  McClintock M.D.   On: 01/25/2018 06:56    12-lead ECG sinus rhythm (personally reviewed) All prior EKG's in EPIC reviewed with no documented atrial fibrillation  Telemetry sinus rhythm (personally reviewed)  Assessment and Plan:  1. Cryptogenic stroke The patient presents with cryptogenic stroke.  The patient has a TEE planned for today.  I spoke at length with the patient about monitoring for afib with an implantable loop recorder.  Risks, benefits, and alteratives to implantable loop recorder were discussed with the patient today.   At this time, the patient is very clear in their decision to proceed with implantable loop recorder.   Wound care was reviewed with the patient (keep incision clean and dry for 3 days).  Wound check scheduled and entered in AVS.  Please call with questions.   Gypsy BalsamAmber Seiler, NP 01/27/2018 12:35 PM

## 2018-01-27 NOTE — Progress Notes (Signed)
  Echocardiogram Echocardiogram Transesophageal has been performed.  Tyree Vandruff G Kyliee Ortego 01/27/2018, 2:06 PM

## 2018-01-28 ENCOUNTER — Encounter (HOSPITAL_COMMUNITY): Payer: Self-pay | Admitting: Internal Medicine

## 2018-01-28 ENCOUNTER — Telehealth: Payer: Self-pay | Admitting: Internal Medicine

## 2018-01-28 NOTE — Telephone Encounter (Signed)
Spoke with patient who states that she is itching around the edges of her steristrips.  I advised that she apply some over the counter anti itch cream but to keep the steri strips dry. She verbalized understanding - confirmed wound check appt 7/2

## 2018-01-28 NOTE — Telephone Encounter (Signed)
PT CALLING   Pt had a loop recorder put in today and is itching and want to know what can she do to stop itching and can she take a bath. Please advise pt

## 2018-01-29 ENCOUNTER — Encounter (HOSPITAL_COMMUNITY): Payer: Self-pay | Admitting: Internal Medicine

## 2018-01-31 LAB — CULTURE, BLOOD (ROUTINE X 2)
Culture: NO GROWTH
Culture: NO GROWTH
SPECIAL REQUESTS: ADEQUATE

## 2018-02-04 ENCOUNTER — Other Ambulatory Visit: Payer: Self-pay | Admitting: *Deleted

## 2018-02-04 ENCOUNTER — Ambulatory Visit (INDEPENDENT_AMBULATORY_CARE_PROVIDER_SITE_OTHER): Payer: Medicare Other | Admitting: *Deleted

## 2018-02-04 DIAGNOSIS — I6330 Cerebral infarction due to thrombosis of unspecified cerebral artery: Secondary | ICD-10-CM

## 2018-02-04 LAB — CUP PACEART INCLINIC DEVICE CHECK
Date Time Interrogation Session: 20190702171606
MDC IDC PG IMPLANT DT: 20190624

## 2018-02-04 NOTE — Progress Notes (Signed)
Wound check in clinic s/p ILR implant. Steri strips removed. Incision edges approximated. Wound well healed without redness or edema. Normal ILR device function. Battery status: GOOD. R-waves 0.4574mV. 0 symptom episodes, 0 tachy episodes, 0 pause episodes, 0 brady episodes. 0 AF episodes (0% burden). Patient education completed including wound care, remote monitoring, and future follow up. Monthly summary reports and ROV with SK in 12 months.

## 2018-02-04 NOTE — Patient Outreach (Signed)
[facility] Care Management  02/04/2018  Smitty KnudsenGwyn O Caradonna Dec 20, 1945 [id number]  Referral via RED Alert, EMMI-Stroke-Day#6, 02/03/2018: Reason: Feeling worse overall-yes  Per review of hx: Admission-6/21-6/24/2019 DX: acute ischemic strokes  Telephone call to patient who was advised of reason for call.  HIPPA verification received from patient.  Patient voices that she had answered to question wrong when she hung up. States she did not feel worse but felt very well & feels well today. States she has had follow up visit with her primary care provider on 6/28. Voices that she has follow up with cardiologist today.  States her spouse has been taking her blood pressure and it has been normal.  States she has not had any further symptoms of slurred speech, headache or dizziness. States she or husband will call 911 if symptoms occur. Other symptoms of stroke reviewed with patient who voices understanding.   States she is taking medications as prescribed by doctors and voices understanding of importance of doing so.   Patient voices no further concerns. EMMI alert has been addressed.  Plan: Case closure.  Colleen CanLinda Jaidev Sanger, RN BSN CCM Care Management Coordinator [facility]  [phone]

## 2018-02-07 ENCOUNTER — Other Ambulatory Visit: Payer: Self-pay

## 2018-02-07 NOTE — Patient Outreach (Addendum)
[facility] Care Management  02/07/2018  April KnudsenGwyn O Batte Jun 19, 1946 [id number]   EMMI- Stroke RED ON EMMI ALERT Day # 9 Date: 02/06/18 Red Alert Reason:  Feeling worse overall? yes  Outreach attempt # 1 spoke with patient.  She is able to verify HIPAA.  Discussed with patient reason for call.  Patient states that the system did not record correctly and that she feels great.  Patient asked about her refill for her Plavix.  She states she only has 21 day supply.  Advised patient to get in contact with Dr. Marlis EdelsonSethi's office about 1 week prior to her running out to inquire about taking the medication longer.  She verbalized understanding.  Patient offers no further questions or concerns.   Plan: RN CM will close case.    Bary Lericheionne J Kariem Wolfson, RN, MSN [facility] Care [facility] Care Management Coordinator Direct Line [phone] Toll Free: [phone]  Fax: [phone]

## 2018-03-03 ENCOUNTER — Ambulatory Visit (INDEPENDENT_AMBULATORY_CARE_PROVIDER_SITE_OTHER): Payer: Medicare Other | Admitting: *Deleted

## 2018-03-03 DIAGNOSIS — I6330 Cerebral infarction due to thrombosis of unspecified cerebral artery: Secondary | ICD-10-CM | POA: Diagnosis not present

## 2018-03-03 NOTE — Progress Notes (Signed)
[facility] Summary Report / Loop Recorder 

## 2018-03-31 ENCOUNTER — Encounter: Payer: Self-pay | Admitting: Neurology

## 2018-03-31 ENCOUNTER — Ambulatory Visit: Payer: Medicare Other | Admitting: Neurology

## 2018-03-31 VITALS — BP 138/67 | Ht 61.0 in | Wt 207.0 lb

## 2018-03-31 DIAGNOSIS — I639 Cerebral infarction, unspecified: Secondary | ICD-10-CM

## 2018-03-31 NOTE — Patient Instructions (Signed)
I had a long d/w patient about his recent cryptogenic strokes, risk for recurrent stroke/TIAs, personally independently reviewed imaging studies and stroke evaluation results and answered questions.Continue aspirin 81 mg daily  But discontinue Plavix as it has been more than 3 weeks since her stroke and she is complaining of increased bruising for secondary stroke prevention and maintain strict control of hypertension with blood pressure goal below 130/90, diabetes with hemoglobin A1c goal below 6.5% and lipids with LDL cholesterol goal below 70 mg/dL. I also advised the patient to eat a healthy diet with plenty of whole grains, cereals, fruits and vegetables, exercise regularly and maintain ideal body weight.he may also consider participation in the [place] stroke prevention trial if interested. She will be given information to review and decide Followup in the future with my nurse practitioner in 6 months or call earlier if necessary   Stroke Prevention Some medical conditions and behaviors are associated with a higher chance of having a stroke. You can help prevent a stroke by making nutrition, lifestyle, and other changes, including managing any medical conditions you may have. What nutrition changes can be made?  Eat healthy foods. You can do this by: ? Choosing foods high in fiber, such as fresh fruits and vegetables and whole grains. ? Eating at least 5 or more servings of fruits and vegetables a day. Try to fill half of your plate at each meal with fruits and vegetables. ? Choosing lean protein foods, such as lean cuts of meat, poultry without skin, fish, tofu, beans, and nuts. ? Eating low-fat dairy products. ? Avoiding foods that are high in salt (sodium). This can help lower blood pressure. ? Avoiding foods that have saturated fat, trans fat, and cholesterol. This can help prevent high cholesterol. ? Avoiding processed and premade foods.  Follow your health care provider's specific  guidelines for losing weight, controlling high blood pressure (hypertension), lowering high cholesterol, and managing diabetes. These may include: ? Reducing your daily calorie intake. ? Limiting your daily sodium intake to 1,500 milligrams (mg). ? Using only healthy fats for cooking, such as olive oil, canola oil, or sunflower oil. ? Counting your daily carbohydrate intake. What lifestyle changes can be made?  Maintain a healthy weight. Talk to your health care provider about your ideal weight.  Get at least 30 minutes of moderate physical activity at least 5 days a week. Moderate activity includes brisk walking, biking, and swimming.  Do not use any products that contain nicotine or tobacco, such as cigarettes and e-cigarettes. If you need help quitting, ask your health care provider. It may also be helpful to avoid exposure to secondhand smoke.  Limit alcohol intake to no more than 1 drink a day for nonpregnant women and 2 drinks a day for men. One drink equals 12 oz of beer, 5 oz of wine, or 1 oz of hard liquor.  Stop any illegal drug use.  Avoid taking birth control pills. Talk to your health care provider about the risks of taking birth control pills if: ? You are over 72 years old. ? You smoke. ? You get migraines. ? You have ever had a blood clot. What other changes can be made?  Manage your cholesterol levels. ? Eating a healthy diet is important for preventing high cholesterol. If cholesterol cannot be managed through diet alone, you may also need to take medicines. ? Take any prescribed medicines to control your cholesterol as told by your health care provider.  Manage your diabetes. ?  Eating a healthy diet and exercising regularly are important parts of managing your blood sugar. If your blood sugar cannot be managed through diet and exercise, you may need to take medicines. ? Take any prescribed medicines to control your diabetes as told by your health care  provider.  Control your hypertension. ? To reduce your risk of stroke, try to keep your blood pressure below 130/80. ? Eating a healthy diet and exercising regularly are an important part of controlling your blood pressure. If your blood pressure cannot be managed through diet and exercise, you may need to take medicines. ? Take any prescribed medicines to control hypertension as told by your health care provider. ? Ask your health care provider if you should monitor your blood pressure at home. ? Have your blood pressure checked every year, even if your blood pressure is normal. Blood pressure increases with age and some medical conditions.  Get evaluated for sleep disorders (sleep apnea). Talk to your health care provider about getting a sleep evaluation if you snore a lot or have excessive sleepiness.  Take over-the-counter and prescription medicines only as told by your health care provider. Aspirin or blood thinners (antiplatelets or anticoagulants) may be recommended to reduce your risk of forming blood clots that can lead to stroke.  Make sure that any other medical conditions you have, such as atrial fibrillation or atherosclerosis, are managed. What are the warning signs of a stroke? The warning signs of a stroke can be easily remembered as BEFAST.  B is for balance. Signs include: ? Dizziness. ? Loss of balance or coordination. ? Sudden trouble walking.  E is for eyes. Signs include: ? A sudden change in vision. ? Trouble seeing.  F is for face. Signs include: ? Sudden weakness or numbness of the face. ? The face or eyelid drooping to one side.  A is for arms. Signs include: ? Sudden weakness or numbness of the arm, usually on one side of the body.  S is for speech. Signs include: ? Trouble speaking (aphasia). ? Trouble understanding.  T is for time. ? These symptoms may represent a serious problem that is an emergency. Do not wait to see if the symptoms will go away.  Get medical help right away. Call your local emergency services (911 in the [place] Do not drive yourself to the hospital.  Other signs of stroke may include: ? A sudden, severe headache with no known cause. ? Nausea or vomiting. ? Seizure.  Where to find more information: For more information, visit:  American Stroke Association: www.strokeassociation.org  National Stroke Association: www.stroke.org  Summary  You can prevent a stroke by eating healthy, exercising, not smoking, limiting alcohol intake, and managing any medical conditions you may have.  Do not use any products that contain nicotine or tobacco, such as cigarettes and e-cigarettes. If you need help quitting, ask your health care provider. It may also be helpful to avoid exposure to secondhand smoke.  Remember BEFAST for warning signs of stroke. Get help right away if you or a loved one has any of these signs. This information is not intended to replace advice given to you by your health care provider. Make sure you discuss any questions you have with your health care provider. Document Released: 08/30/2004 Document Revised: 08/28/2016 Document Reviewed: 08/28/2016 Elsevier Interactive Patient Education  [employer].

## 2018-03-31 NOTE — Progress Notes (Signed)
[facility] [address] [place]. [place] [address] [phone]       OFFICE FOLLOW-UP NOTE  [name] Date of Birth:  May 03, 1946 Medical Record Number:  [id number]   HPI: April Mcdonald is a pleasant 72 year old Caucasian lady seen today for initial office follow-up visit for hospital admission for stroke in June 2019. She is accompanied by her husband. History is obtained from them as well as review of electronic medical records. I have personally reviewed imaging films.April O Lanieris an 72 y.o.femalewho has medical history of HTN, HLD and seizure disorder. She presented to a local Urgent Care center on evening of 01/24/18 with sudden onset of dizziness, slurred speech that lasted about 5-59min and then she states she simply felt "unwell". Urgent care sent her to [facility] ER for TIA work up. CTH showed no acute findings, but MRI showed patchy acute ischemic nonhemorrhagic infarcts involving bilateral cerebellar hemispheres right greater than left. There is also small remote age right frontal cortical infarct.. She denies any CP or palpitations or SOB. She had no LOC or vision changes. She said she had a focal right h/a thereafter, but this is now gone and all symptoms have resolved at this time. NIH 0. To note, her seizure disorder has been well controlled on Dilantin and has been seizure free for over 39yrs and at baseline is healthy and active (mRS 0).MR angiogram of the head showed only mild intracranial atherosclerotic changes. CT angiogram of the neck showed no significant extracranial stenosis. Transthoracic echo showed normal ejection fraction. Transesophageal echo showed no cord excessive embolism, vegetation or clot. LDL cholesterol was 110 mg percent and hemoglobin A1c was 5.8. Patient had a loop recorder inserted insofar paroxysmal A. Fib has not yet been found. She states she meant for neurological recovery and has no trouble with dizziness and balance. She is  tolerating aspirin and Plavix well but does complain of easy bruising. She has had no bleeding episodes. She states her blood pressure is well controlled and today it is borderline at 138/67. She is tolerating Lipitor 80 mg well without muscle aches and pains. She has an appointment with primary care physician tomorrow and plans to have follow-up lipid profile checked. She's had no recurrent stroke or TIA symptoms. She has remote history of 2 seizures in 1980s and has been on Dilantin 400 mg daily which is tolerating well without side effects and does not want to reduce the dose. andhenytoin level in the hospital was 16.8 mg percent  ROS:   14 system review of systems is positive for  No complaints today and all systems negative  PMH:  Past Medical History:  Diagnosis Date  . Hypertension   . Seizures (HCC)   . Stroke [facility])     Social History:  Social History   Socioeconomic History  . Marital status: Married    Spouse name: Not on file  . Number of children: Not on file  . Years of education: Not on file  . Highest education level: Not on file  Occupational History  . Not on file  Social Needs  . Financial resource strain: Not on file  . Food insecurity:    Worry: Not on file    Inability: Not on file  . Transportation needs:    Medical: Not on file    Non-medical: Not on file  Tobacco Use  . Smoking status: Never Smoker  . Smokeless tobacco: Never Used  Substance and Sexual Activity  . Alcohol  use: Never    Frequency: Never  . Drug use: Never  . Sexual activity: Not on file  Lifestyle  . Physical activity:    Days per week: Not on file    Minutes per session: Not on file  . Stress: Not on file  Relationships  . Social connections:    Talks on phone: Not on file    Gets together: Not on file    Attends religious service: Not on file    Active member of club or organization: Not on file    Attends meetings of clubs or organizations: Not on file    Relationship  status: Not on file  . Intimate partner violence:    Fear of current or ex partner: Not on file    Emotionally abused: Not on file    Physically abused: Not on file    Forced sexual activity: Not on file  Other Topics Concern  . Not on file  Social History Narrative  . Not on file    Medications:   Current Outpatient Medications on File Prior to Visit  Medication Sig Dispense Refill  . ascorbic acid (VITAMIN C) 500 MG tablet Take 500 mg by mouth daily.    Marland Kitchen. aspirin EC 81 MG EC tablet Take 1 tablet (81 mg total) by mouth daily. 30 tablet 0  . atorvastatin (LIPITOR) 10 MG tablet     . atorvastatin (LIPITOR) 80 MG tablet Take 1 tablet (80 mg total) by mouth daily at 6 PM. 30 tablet 0  . Carboxymethylcellulose Sodium (EYE DROPS OP) Place 2-3 drops into both eyes daily as needed (Itchy eyes).    . Cholecalciferol (VITAMIN D3) 1000 units CAPS Take 2,000 Units by mouth daily.    . diphenoxylate-atropine (LOMOTIL) 2.5-0.025 MG tablet Take by mouth 4 (four) times daily as needed for diarrhea or loose stools.    . fluticasone (FLONASE) 50 MCG/ACT nasal spray Place 2 sprays into both nostrils daily as needed for congestion.    Marland Kitchen. loratadine (CLARITIN) 10 MG tablet Take 10 mg by mouth daily as needed for allergies.    . phenytoin (DILANTIN) 100 MG ER capsule Take 400 mg by mouth daily.     Marland Kitchen. triamterene-hydrochlorothiazide (DYAZIDE) 37.5-25 MG capsule Take 1 each (1 capsule total) by mouth daily.    . vitamin B-12 (CYANOCOBALAMIN) 500 MCG tablet Take 500 mcg by mouth daily.     No current facility-administered medications on file prior to visit.     Allergies:  No Known Allergies  Physical Exam General: obese elderly Caucasian lady seated, in no evident distress Head: head normocephalic and atraumatic.  Neck: supple with no carotid or supraclavicular bruits Cardiovascular: regular rate and rhythm, no murmurs Musculoskeletal: no deformity Skin:  no rash/petichiae Vascular:  Normal pulses  all extremities Vitals:   03/31/18 1410  BP: 138/67   Neurologic Exam Mental Status: Awake and fully alert. Oriented to place and time. Recent and remote memory intact. Attention span, concentration and fund of knowledge appropriate. Mood and affect appropriate.  Cranial Nerves: Fundoscopic exam reveals sharp disc margins. Pupils equal, briskly reactive to light. Extraocular movements full without nystagmus. Visual fields full to confrontation. Hearing intact. Facial sensation intact. Face, tongue, palate moves normally and symmetrically.  Motor: Normal bulk and tone. Normal strength in all tested extremity muscles. Sensory.: intact to touch ,pinprick .position and vibratory sensation.  Coordination: Rapid alternating movements normal in all extremities. Finger-to-nose and heel-to-shin performed accurately bilaterally. Gait and Station: Arises from chair  without difficulty. Stance is normal. Gait demonstrates normal stride length and balance . Able to heel, toe and tandem walk with oderate difficulty.  Reflexes: 1+ and symmetric. Toes downgoing.   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 Vascular risk factors of hypertension and hyperlipidemia and mild obesity.she has remote history of 2 seizures in 32s and has been on Dilantin 400 mg daily since then without recurrent breakthrough seizures and does not want to reduce the dose     PLAN: I had a long d/w patient about his recent cryptogenic strokes, risk for recurrent stroke/TIAs, personally independently reviewed imaging studies and stroke evaluation results and answered questions.Continue aspirin 81 mg daily  But discontinue Plavix as it has been more than 3 weeks since her stroke and she is complaining of increased bruising for secondary stroke prevention and maintain strict control of hypertension with blood pressure goal below 130/90, diabetes  with hemoglobin A1c goal below 6.5% and lipids with LDL cholesterol goal below 70 mg/dL. I also advised the patient to eat a healthy diet with plenty of whole grains, cereals, fruits and vegetables, exercise regularly and maintain ideal body weight.he may also consider participation in the [place] stroke prevention trial if interested. She will be given information to review and decide Followup in the future with my nurse practitioner in 6 months or call earlier if necessary Greater than 50% of time during this 25 minute visit was spent on counseling,explanation of diagnosis of cryptogenic stroke,, planning of further management, discussion with patient and family and coordination of care Delia Heady, MD  [facility] [address] [place], [place] [address]  Phone [phone] Fax [phone] Note: This document was prepared with digital dictation and possible smart phrase technology. Any transcriptional errors that result from this process are unintentional

## 2018-04-03 ENCOUNTER — Ambulatory Visit (INDEPENDENT_AMBULATORY_CARE_PROVIDER_SITE_OTHER): Payer: Medicare Other | Admitting: *Deleted

## 2018-04-03 DIAGNOSIS — I6330 Cerebral infarction due to thrombosis of unspecified cerebral artery: Secondary | ICD-10-CM | POA: Diagnosis not present

## 2018-04-04 NOTE — Progress Notes (Signed)
[facility] Summary Report / Loop Recorder 

## 2018-04-14 LAB — CUP PACEART REMOTE DEVICE CHECK
Implantable Pulse Generator Implant Date: 20190624
MDC IDC SESS DTM: 20190727200621

## 2018-04-30 LAB — CUP PACEART REMOTE DEVICE CHECK
Implantable Pulse Generator Implant Date: 20190624
MDC IDC SESS DTM: 20190829203537

## 2018-05-06 ENCOUNTER — Ambulatory Visit (INDEPENDENT_AMBULATORY_CARE_PROVIDER_SITE_OTHER): Payer: Medicare Other | Admitting: *Deleted

## 2018-05-06 DIAGNOSIS — I6330 Cerebral infarction due to thrombosis of unspecified cerebral artery: Secondary | ICD-10-CM | POA: Diagnosis not present

## 2018-05-07 LAB — CUP PACEART REMOTE DEVICE CHECK
Date Time Interrogation Session: 20191001213946
MDC IDC PG IMPLANT DT: 20190624

## 2018-05-07 NOTE — Progress Notes (Signed)
[facility] Summary Report / Loop Recorder 

## 2018-06-09 ENCOUNTER — Ambulatory Visit (INDEPENDENT_AMBULATORY_CARE_PROVIDER_SITE_OTHER): Payer: Medicare Other | Admitting: *Deleted

## 2018-06-09 DIAGNOSIS — I6330 Cerebral infarction due to thrombosis of unspecified cerebral artery: Secondary | ICD-10-CM

## 2018-06-09 NOTE — Progress Notes (Signed)
[facility] Summary Report / Loop Recorder 

## 2018-07-11 ENCOUNTER — Ambulatory Visit (INDEPENDENT_AMBULATORY_CARE_PROVIDER_SITE_OTHER): Payer: Medicare Other

## 2018-07-11 DIAGNOSIS — I6330 Cerebral infarction due to thrombosis of unspecified cerebral artery: Secondary | ICD-10-CM | POA: Diagnosis not present

## 2018-07-14 NOTE — Progress Notes (Signed)
[facility] Summary Report / Loop Recorder 

## 2018-08-02 LAB — CUP PACEART REMOTE DEVICE CHECK
Implantable Pulse Generator Implant Date: 20190624
MDC IDC SESS DTM: 20191103223959

## 2018-08-13 ENCOUNTER — Ambulatory Visit (INDEPENDENT_AMBULATORY_CARE_PROVIDER_SITE_OTHER): Payer: Medicare Other

## 2018-08-13 DIAGNOSIS — I6330 Cerebral infarction due to thrombosis of unspecified cerebral artery: Secondary | ICD-10-CM

## 2018-08-14 LAB — CUP PACEART REMOTE DEVICE CHECK
Date Time Interrogation Session: 20200108234004
MDC IDC PG IMPLANT DT: 20190624

## 2018-08-14 NOTE — Progress Notes (Signed)
[facility] Summary Report / Loop Recorder 

## 2018-08-24 LAB — CUP PACEART REMOTE DEVICE CHECK
Date Time Interrogation Session: 20191206231100
Implantable Pulse Generator Implant Date: 20190624

## 2018-09-15 ENCOUNTER — Ambulatory Visit (INDEPENDENT_AMBULATORY_CARE_PROVIDER_SITE_OTHER): Payer: Medicare Other

## 2018-09-15 DIAGNOSIS — I6330 Cerebral infarction due to thrombosis of unspecified cerebral artery: Secondary | ICD-10-CM

## 2018-09-16 LAB — CUP PACEART REMOTE DEVICE CHECK
Date Time Interrogation Session: 20200210234056
Implantable Pulse Generator Implant Date: 20190624

## 2018-09-26 NOTE — Progress Notes (Signed)
[facility] Summary Report / Loop Recorder 

## 2018-10-01 ENCOUNTER — Ambulatory Visit: Payer: Medicare Other | Admitting: Adult Health

## 2018-10-06 ENCOUNTER — Other Ambulatory Visit: Payer: Self-pay | Admitting: Internal Medicine

## 2018-10-06 DIAGNOSIS — Z1231 Encounter for screening mammogram for malignant neoplasm of breast: Secondary | ICD-10-CM

## 2018-10-20 ENCOUNTER — Other Ambulatory Visit: Payer: Self-pay

## 2018-10-20 ENCOUNTER — Ambulatory Visit (INDEPENDENT_AMBULATORY_CARE_PROVIDER_SITE_OTHER): Payer: Medicare Other | Admitting: *Deleted

## 2018-10-20 DIAGNOSIS — I6330 Cerebral infarction due to thrombosis of unspecified cerebral artery: Secondary | ICD-10-CM

## 2018-10-21 LAB — CUP PACEART REMOTE DEVICE CHECK
Date Time Interrogation Session: 20200315001044
Implantable Pulse Generator Implant Date: 20190624

## 2018-10-28 NOTE — Progress Notes (Signed)
[facility] Summary Report / Loop Recorder 

## 2018-11-04 IMAGING — MR MR MRA HEAD W/O CM
10 of 12 series · 32 of 48 positions shown · non-contrast
Comparison: Prior CT from 01/24/2018.

CLINICAL DATA: Initial evaluation for acute TIA, dizziness with
slurred speech.

EXAM:
MRI HEAD WITHOUT CONTRAST
MRA HEAD WITHOUT CONTRAST
TECHNIQUE: Multiplanar, multiecho pulse sequences of the brain and surrounding
structures were obtained without intravenous contrast. Angiographic
images of the head were obtained using MRA technique without
contrast.

[Series 5: ax dwi_tracew · axial · 3.0mm · 1.50mm/px · z∈[-88,+51]mm · 6 of 80 slices shown]
[im 1/80]
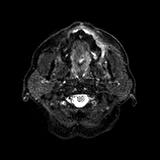
[im 16/80]
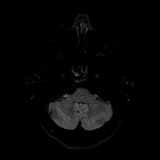
[im 32/80]
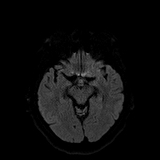
[im 48/80]
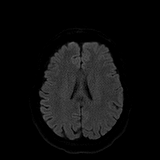
[im 64/80]
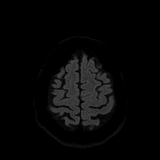
[im 80/80]
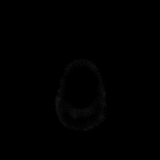

[Series 6: ax dwi_adc · axial · 3.0mm · 1.50mm/px · z∈[-88,+51]mm · 3 of 40 slices shown]
[im 1/40]
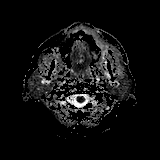
[im 20/40]
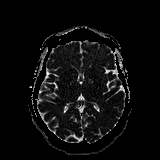
[im 40/40]
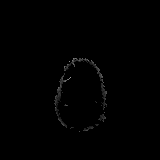

[Series 7: cor dwi_tracew · coronal · 5.0mm · 1.44mm/px · 5 of 64 slices shown]
[im 1/64]
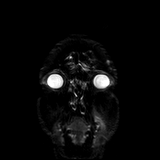
[im 16/64]
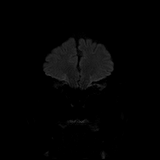
[im 32/64]
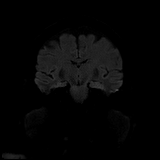
[im 48/64]
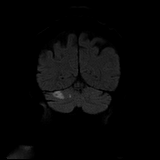
[im 64/64]
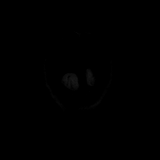

[Series 8: cor dwi_adc · coronal · 5.0mm · 1.44mm/px · 2 of 32 slices shown]
[im 1/32]
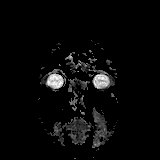
[im 32/32]
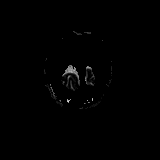

[Series 13: T1 · sagittal · 5.0mm · 0.75mm/px · 2 of 24 slices shown]
[im 1/24]
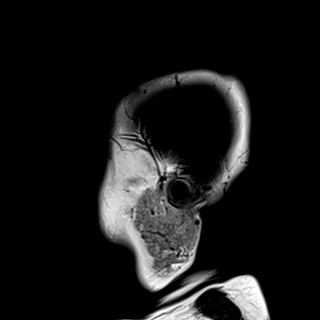
[im 24/24]
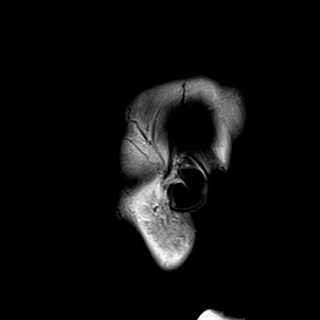

[Series 14: T2 · axial · 5.0mm · 0.69mm/px · z∈[-84,+59]mm · 2 of 25 slices shown (1 of 2)]
[im 1/25]
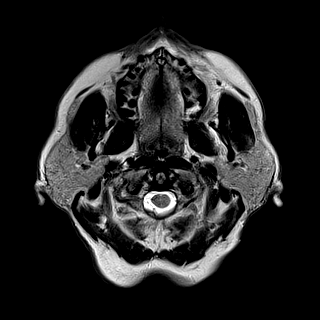
[im 25/25]
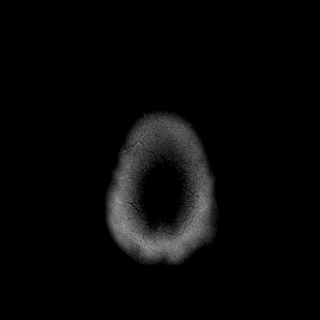

[Series 15: FLAIR · axial · 5.0mm · 0.43mm/px · z∈[-83,+60]mm · 2 of 25 slices shown]
[im 1/25]
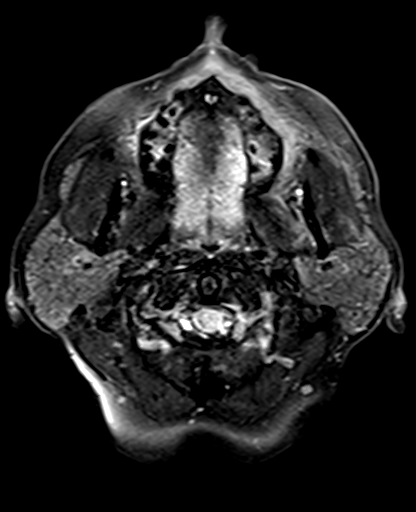
[im 25/25]
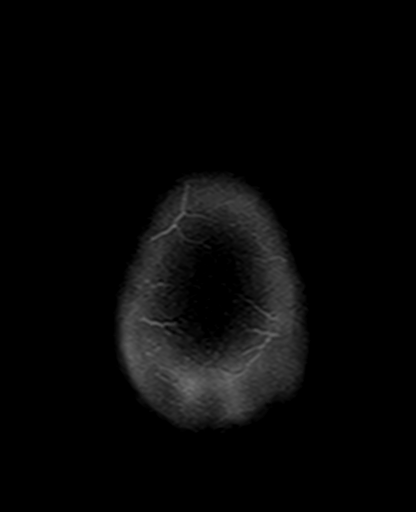

[Series 16: swi_images · axial · 3.0mm · 0.86mm/px · z∈[-105,+69]mm · 4 of 60 slices shown]
[im 1/60]
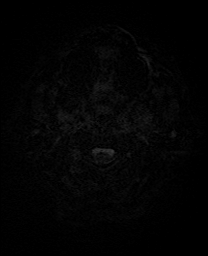
[im 20/60]
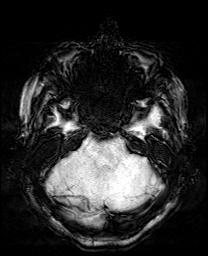
[im 40/60]
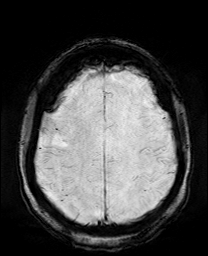
[im 60/60]
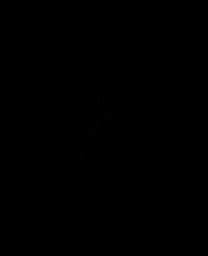

[Series 17: mip_images(sw) · axial · 24.0mm · 0.86mm/px · z∈[-94,+59]mm · 4 of 53 slices shown]
[im 1/53]
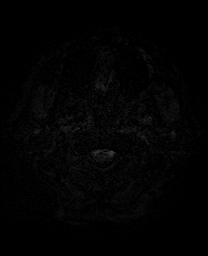
[im 18/53]
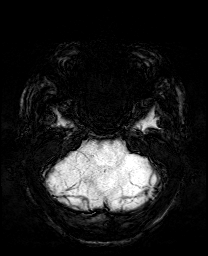
[im 35/53]
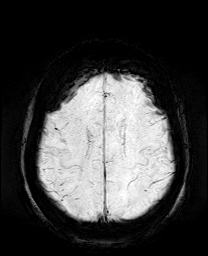
[im 53/53]
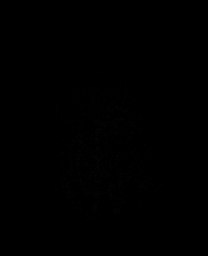

[Series 19: T2 · coronal · 5.0mm · 0.34mm/px · 2 of 29 slices shown (2 of 2)]
[im 1/29]
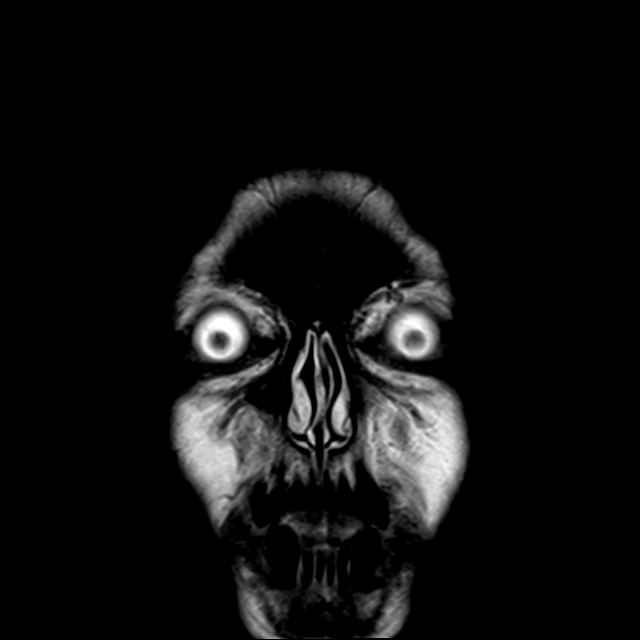
[im 29/29]
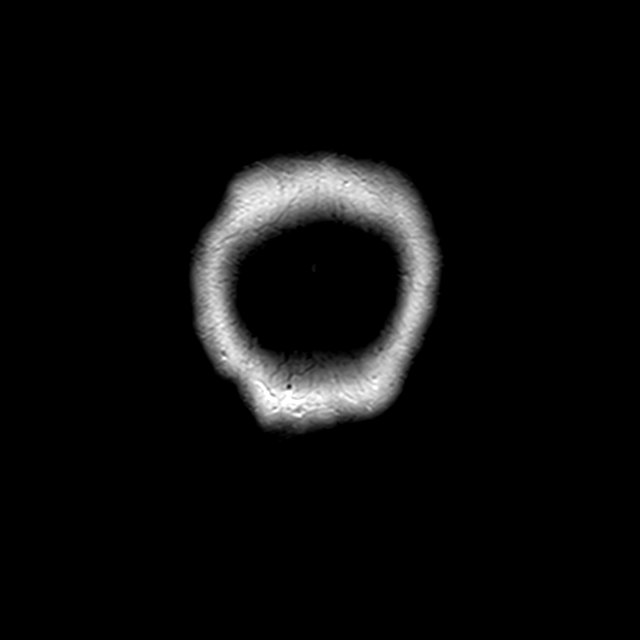

[32 of 48 positions shown; findings below may reference images not displayed]

FINDINGS: MRI HEAD FINDINGS

Brain: Cerebral volume within normal limits. Small remote right
frontal cortical infarct noted. Patchy multifocal areas of
restricted diffusion seen involving the bilateral cerebellar
hemispheres, right greater than left, consistent with acute ischemic
infarcts. Largest area of infarction measures 16 mm. No associated
hemorrhage or mass effect. No other evidence for acute or subacute
ischemia. No acute or chronic intracranial hemorrhage.

No mass lesion, midline shift or mass effect. No hydrocephalus or
extra-axial fluid collection. Pituitary gland within normal limits.

Vascular: Major intracranial vascular flow voids maintained.

Skull and upper cervical spine: Craniocervical junction normal.
Upper cervical spine within normal limits. Normal bone marrow signal
intensity. Scalp soft tissues unremarkable.

Sinuses/Orbits: Globes and orbital soft tissues within normal
limits. Small air-fluid level noted within the right maxillary
sinus. Small bilateral mastoid effusions, of doubtful significance.
Inner ear structures within normal limits.

Other: None.

MRA HEAD FINDINGS

ANTERIOR CIRCULATION:

Distal cervical segments of the internal carotid arteries are patent
with antegrade flow. Petrous, cavernous, and supraclinoid segments
demonstrate mild atheromatous irregularity but are patent to the
termini without hemodynamically significant stenosis. A1 segments,
anterior communicating artery common anterior cerebral arteries
patent bilaterally. No M1 stenosis or occlusion. Normal MCA
bifurcations. Distal MCA branches well perfused and symmetric mild
small vessel atheromatous irregularity.

POSTERIOR CIRCULATION:

Right vertebral artery dominant and patent to the vertebrobasilar
junction without stenosis. Hypoplastic left vertebral artery, also
patent to the vertebrobasilar junction. Posterior inferior cerebral
arteries patent bilaterally. Basilar diminutive but patent to its
distal aspect without significant stenosis. Superior cerebral
arteries patent bilaterally. Fetal type origin of the right PCA.
Hypoplastic left P1 with prominent left posterior communicating
artery. PCAs patent to their distal aspects without stenosis. Mild
small vessel atheromatous irregularity.
IMPRESSION: MRI HEAD IMPRESSION:

1. Patchy acute ischemic nonhemorrhagic infarcts involving the
bilateral cerebellar hemispheres, right greater than left.
2. Small remote right frontal cortical infarct.
3. Air-fluid level within the right maxillary sinus, suggesting
acute sinusitis.

MRA HEAD IMPRESSION:

1. Mild for age intracranial atherosclerotic change without large
vessel occlusion or hemodynamically significant stenosis.
2. Predominant fetal type origin of the PCAs with overall diminutive
vertebrobasilar system.

## 2018-11-10 ENCOUNTER — Ambulatory Visit: Payer: Medicare Other

## 2018-11-20 ENCOUNTER — Other Ambulatory Visit: Payer: Self-pay

## 2018-11-20 ENCOUNTER — Ambulatory Visit (INDEPENDENT_AMBULATORY_CARE_PROVIDER_SITE_OTHER): Payer: Medicare Other | Admitting: *Deleted

## 2018-11-20 DIAGNOSIS — I6330 Cerebral infarction due to thrombosis of unspecified cerebral artery: Secondary | ICD-10-CM | POA: Diagnosis not present

## 2018-11-21 LAB — CUP PACEART REMOTE DEVICE CHECK
Date Time Interrogation Session: 20200417004007
Implantable Pulse Generator Implant Date: 20190624

## 2018-11-26 NOTE — Progress Notes (Signed)
[facility] Summary Report / Loop Recorder 

## 2018-12-22 ENCOUNTER — Ambulatory Visit: Payer: Medicare Other

## 2018-12-23 ENCOUNTER — Other Ambulatory Visit: Payer: Self-pay

## 2018-12-23 ENCOUNTER — Ambulatory Visit (INDEPENDENT_AMBULATORY_CARE_PROVIDER_SITE_OTHER): Payer: Medicare Other | Admitting: *Deleted

## 2018-12-23 DIAGNOSIS — I6330 Cerebral infarction due to thrombosis of unspecified cerebral artery: Secondary | ICD-10-CM

## 2018-12-24 LAB — CUP PACEART REMOTE DEVICE CHECK
Date Time Interrogation Session: 20200520013831
Implantable Pulse Generator Implant Date: 20190624

## 2019-01-01 NOTE — Progress Notes (Signed)
[facility] Summary Report / Loop Recorder 

## 2019-01-26 ENCOUNTER — Encounter: Payer: Medicare Other | Admitting: *Deleted

## 2019-01-30 ENCOUNTER — Ambulatory Visit
Admission: RE | Admit: 2019-01-30 | Discharge: 2019-01-30 | Disposition: A | Discharge status: Home / Self Care | Payer: Medicare Other | Source: Ambulatory Visit | Attending: Internal Medicine | Admitting: Internal Medicine

## 2019-01-30 ENCOUNTER — Other Ambulatory Visit: Payer: Self-pay

## 2019-01-30 DIAGNOSIS — Z1231 Encounter for screening mammogram for malignant neoplasm of breast: Secondary | ICD-10-CM

## 2019-02-12 ENCOUNTER — Telehealth: Payer: Self-pay

## 2019-02-12 NOTE — Telephone Encounter (Signed)
Virtual Visit Pre-Appointment Phone Call  "(Name), I am calling you today to discuss your upcoming appointment. We are currently trying to limit exposure to the virus that causes COVID-19 by seeing patients at home rather than in the office."  1. "What is the BEST phone number to call the day of the visit?" - include this in appointment notes  2. "Do you have or have access to (through a family member/friend) a smartphone with video capability that we can use for your visit?" a. If yes - list this number in appt notes as "cell" (if different from BEST phone #) and list the appointment type as a VIDEO visit in appointment notes b. If no - list the appointment type as a PHONE visit in appointment notes  3. Confirm consent - "In the setting of the current Covid19 crisis, you are scheduled for a (phone or video) visit with your provider on (date) at (time).  Just as we do with many in-office visits, in order for you to participate in this visit, we must obtain consent.  If you'd like, I can send this to your mychart (if signed up) or email for you to review.  Otherwise, I can obtain your verbal consent now.  All virtual visits are billed to your insurance company just like a normal visit would be.  By agreeing to a virtual visit, we'd like you to understand that the technology does not allow for your provider to perform an examination, and thus may limit your provider's ability to fully assess your condition. If your provider identifies any concerns that need to be evaluated in person, we will make arrangements to do so.  Finally, though the technology is pretty good, we cannot assure that it will always work on either your or our end, and in the setting of a video visit, we may have to convert it to a phone-only visit.  In either situation, we cannot ensure that we have a secure connection.  Are you willing to proceed?" STAFF: Did the patient verbally acknowledge consent to telehealth visit? Document  YES/NO here: YES  4. Advise patient to be prepared - "Two hours prior to your appointment, go ahead and check your blood pressure, pulse, oxygen saturation, and your weight (if you have the equipment to check those) and write them all down. When your visit starts, your provider will ask you for this information. If you have an Apple Watch or Kardia device, please plan to have heart rate information ready on the day of your appointment. Please have a pen and paper handy nearby the day of the visit as well."  5. Give patient instructions for MyChart download to smartphone OR Doximity/Doxy.me as below if video visit (depending on what platform provider is using)  6. Inform patient they will receive a phone call 15 minutes prior to their appointment time (may be from unknown caller ID) so they should be prepared to answer    TELEPHONE CALL NOTE  April Mcdonald has been deemed a candidate for a follow-up tele-health visit to limit community exposure during the Covid-19 pandemic. I spoke with the patient via phone to ensure availability of phone/video source, confirm preferred email & phone number, and discuss instructions and expectations.  I reminded April Mcdonald to be prepared with any vital sign and/or heart rhythm information that could potentially be obtained via home monitoring, at the time of her visit. I reminded April Mcdonald to expect a phone call prior to  her visit.  Lajoyce Cornersmily R Soyla Bainter, CMA 02/12/2019 12:16 PM   INSTRUCTIONS FOR DOWNLOADING THE MYCHART APP TO SMARTPHONE  - The patient must first make sure to have activated MyChart and know their login information - If Apple, go to [employer] and type in MyChart in the search bar and download the app. If Android, ask patient to go to [employer] Play Store and type in NageeziMyChart in the search bar and download the app. The app is free but as with any other app downloads, their phone may require them to verify saved payment information or  Apple/Android password.  - The patient will need to then log into the app with their MyChart username and password, and select [facility] as their healthcare provider to link the account. When it is time for your visit, go to the MyChart app, find appointments, and click Begin Video Visit. Be sure to Select Allow for your device to access the Microphone and Camera for your visit. You will then be connected, and your provider will be with you shortly.  **If they have any issues connecting, or need assistance please contact MyChart service desk [phone])**  **If using a computer, in order to ensure the best quality for their visit they will need to use either of the following Internet Browsers: [employer] Edge, or Google Chrome**  IF USING DOXIMITY or DOXY.ME - The patient will receive a link just prior to their visit by text.     FULL LENGTH CONSENT FOR TELE-HEALTH VISIT   I hereby voluntarily request, consent and authorize [facility] and its employed or contracted physicians, physician assistants, nurse practitioners or other licensed health care professionals (the Practitioner), to provide me with telemedicine health care services (the "Services") as deemed necessary by the treating Practitioner. I acknowledge and consent to receive the Services by the Practitioner via telemedicine. I understand that the telemedicine visit will involve communicating with the Practitioner through live audiovisual communication technology and the disclosure of certain medical information by electronic transmission. I acknowledge that I have been given the opportunity to request an in-person assessment or other available alternative prior to the telemedicine visit and am voluntarily participating in the telemedicine visit.  I understand that I have the right to withhold or withdraw my consent to the use of telemedicine in the course of my care at any time, without affecting my right to future care  or treatment, and that the Practitioner or I may terminate the telemedicine visit at any time. I understand that I have the right to inspect all information obtained and/or recorded in the course of the telemedicine visit and may receive copies of available information for a reasonable fee.  I understand that some of the potential risks of receiving the Services via telemedicine include:  Marland Kitchen. Delay or interruption in medical evaluation due to technological equipment failure or disruption; . Information transmitted may not be sufficient (e.g. poor resolution of images) to allow for appropriate medical decision making by the Practitioner; and/or  . In rare instances, security protocols could fail, causing a breach of personal health information.  Furthermore, I acknowledge that it is my responsibility to provide information about my medical history, conditions and care that is complete and accurate to the best of my ability. I acknowledge that Practitioner's advice, recommendations, and/or decision may be based on factors not within their control, such as incomplete or inaccurate data provided by me or distortions of diagnostic images or specimens that may result from electronic transmissions.  I understand that the practice of medicine is not an exact science and that Practitioner makes no warranties or guarantees regarding treatment outcomes. I acknowledge that I will receive a copy of this consent concurrently upon execution via email to the email address I last provided but may also request a printed copy by calling the office of [facility].    I understand that my insurance will be billed for this visit.   I have read or had this consent read to me. . I understand the contents of this consent, which adequately explains the benefits and risks of the Services being provided via telemedicine.  . I have been provided ample opportunity to ask questions regarding this consent and the Services and have had  my questions answered to my satisfaction. . I give my informed consent for the services to be provided through the use of telemedicine in my medical care  By participating in this telemedicine visit I agree to the above.

## 2019-02-19 ENCOUNTER — Telehealth (INDEPENDENT_AMBULATORY_CARE_PROVIDER_SITE_OTHER): Payer: Medicare Other | Admitting: Internal Medicine

## 2019-02-19 ENCOUNTER — Other Ambulatory Visit: Payer: Self-pay

## 2019-02-19 ENCOUNTER — Encounter: Payer: Self-pay | Admitting: Internal Medicine

## 2019-02-19 VITALS — BP 139/68 | HR 78 | Ht 61.0 in | Wt 188.0 lb

## 2019-02-19 DIAGNOSIS — I6330 Cerebral infarction due to thrombosis of unspecified cerebral artery: Secondary | ICD-10-CM | POA: Diagnosis not present

## 2019-02-19 DIAGNOSIS — Z959 Presence of cardiac and vascular implant and graft, unspecified: Secondary | ICD-10-CM | POA: Diagnosis not present

## 2019-02-19 NOTE — Patient Instructions (Signed)
Medication Instructions:   Your physician recommends that you continue on your current medications as directed. Please refer to the Current Medication list given to you today.  If you need a refill on your cardiac medications before your next appointment, please call your pharmacy.   Lab work:  None ordered today  If you have labs (blood work) drawn today and your tests are completely normal, you will receive your results only by: . MyChart Message (if you have MyChart) OR . A paper copy in the mail If you have any lab test that is abnormal or we need to change your treatment, we will call you to review the results.  Testing/Procedures:  None ordered today  Follow-Up:  As needed   

## 2019-02-19 NOTE — Progress Notes (Signed)
Electrophysiology TeleHealth Note   Due to national recommendations of social distancing due to COVID 19, an audio/video telehealth visit is felt to be most appropriate for this patient at this time.  See MyChart message from today for the patient's consent to telehealth for [facility].   Date:  02/19/2019   ID:  April Mcdonald, DOB September 25, 1945, MRN [id number]  Location: patient's home  Provider location: [address], [place]  Evaluation Performed: Follow-up visit  PCP:  Roberts GaudyBurns, Kevin L., MD    Electrophysiologist:  SK   Chief Complaint: Cryptogenic Stroke and loop recorder  History of Present Illness:    April Mcdonald is a [age] y.o. female who presents via audio/video conferencing for a telehealth visit today.  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 The patient did not have access to video technology/had technical difficulties with video requiring transitioning to audio format only (telephone).  All issues noted in this document were discussed and addressed.  No physical exam could be performed with this format.     Since that time, the patient reports doing exceptionally well  The patient denies chest pain, shortness of breath, nocturnal dyspnea, orthopnea or peripheral edema.  There have been no palpitations, lightheadedness or syncope.      The patient denies symptoms of fevers, chills, cough, or new SOB worrisome for COVID 19.    Past Medical History:  Diagnosis Date  . Hypertension   . Seizures (HCC)   . Stroke [facility](HCC)     Past Surgical History:  Procedure Laterality Date  . LOOP RECORDER INSERTION N/A 01/27/2018   Procedure: LOOP RECORDER INSERTION;  Surgeon: Duke SalviaKlein, Faizah Kandler C, MD;  [facility];  Service: Cardiovascular;  Laterality: N/A;  . LOOP RECORDER INSERTION  01/27/2018   Procedure: LOOP RECORDER INSERTION;  Surgeon: Pricilla Riffleoss, Paula V, MD;  [facility];  Service: Cardiovascular;;  . TEE WITHOUT CARDIOVERSION N/A 01/27/2018   Procedure: TRANSESOPHAGEAL ECHOCARDIOGRAM (TEE);  Surgeon: Pricilla Riffleoss, Paula V, MD;  [facility];  Service: Cardiovascular;  Laterality: N/A;    Current Outpatient Medications  Medication Sig Dispense Refill  . ascorbic acid (VITAMIN C) 500 MG tablet Take 500 mg by mouth daily.    Marland Kitchen. aspirin EC 81 MG EC tablet Take 1 tablet (81 mg total) by mouth daily. 30 tablet 0  . atorvastatin (LIPITOR) 80 MG tablet Take 1 tablet (80 mg total) by mouth daily at 6 PM. 30 tablet 0  . Carboxymethylcellulose Sodium (EYE DROPS OP) Place 2-3 drops into both eyes daily as needed (Itchy eyes).    . Cholecalciferol (VITAMIN D3) 1000 units CAPS Take 2,000 Units by mouth daily.    . diphenoxylate-atropine (LOMOTIL) 2.5-0.025 MG tablet Take by mouth 4 (four) times daily as needed for diarrhea or loose stools.    . fluticasone (FLONASE) 50 MCG/ACT nasal spray Place 2 sprays into both nostrils daily as needed for congestion.    Marland Kitchen. loratadine (CLARITIN) 10 MG tablet Take 10 mg by mouth daily as needed for allergies.    . phenytoin (DILANTIN) 100 MG ER capsule Take 400 mg by mouth daily.     Marland Kitchen. triamterene-hydrochlorothiazide (DYAZIDE) 37.5-25 MG capsule Take 1 each (1 capsule total) by mouth daily.    . vitamin B-12 (CYANOCOBALAMIN) 500 MCG tablet Take 500 mcg by mouth daily.     No current facility-administered medications for this visit.  Allergies:   Patient has no known allergies.   Social History:  The patient  reports that she has never smoked. She has never used smokeless tobacco. She reports that she does not drink alcohol or use drugs.   Family History:  The patient's   family history includes Breast cancer in her cousin.   ROS:  Please see the history of present illness.   All other systems are personally reviewed and negative.    Exam:    Vital Signs:  BP 139/68   Pulse 78   Ht 5\' 1"  (1.549 m)   Wt 188 lb (85.3  kg)   BMI 35.52 kg/m       Labs/Other Tests and Data Reviewed:    Recent Labs: No results found for requested labs within last 8760 hours.   Wt Readings from Last 3 Encounters:  02/19/19 188 lb (85.3 kg)  03/31/18 207 lb (93.9 kg)  01/25/18 206 lb 5.6 oz (93.6 kg)     Other studies personally reviewed: Additional studies/ records that were reviewed today include:   Review of the above records today demonstrates:  Prior radiographs:   The patient presents wearable device technology report for my review today. On my review, the patient presents with Apple Watch tracings from  . The tracings reveal   Last device remote is reviewed from [place] PDF dated 5/20 which reveals normal device function,   arrhythmias - none    ASSESSMENT & PLAN:    Cryptogenic stroke  Implantable loop recorder   COVID 19 screen The patient denies symptoms of COVID 19 at this time.  The importance of social distancing was discussed today.  Follow-up:  Prn Next remote:    Current medicines are reviewed at length with the patient today.   The patient  concerns regarding her medicines.  The following changes were made today:    Labs/ tests ordered today include:  No orders of the defined types were placed in this encounter.   Future tests ( post COVID )     Patient Risk:  after full review of this patients clinical status, I feel that they are at moderate risk at this time.  Today, I have spent 5 minutes with the patient with telehealth technology discussing the above.  Signed, Virl Axe, MD  02/19/2019 2:08 PM     [place] [address] [place] [address] [phone] (office) [phone] (fax)

## 2019-02-27 ENCOUNTER — Ambulatory Visit (INDEPENDENT_AMBULATORY_CARE_PROVIDER_SITE_OTHER): Payer: Medicare Other | Admitting: *Deleted

## 2019-02-27 DIAGNOSIS — I6330 Cerebral infarction due to thrombosis of unspecified cerebral artery: Secondary | ICD-10-CM | POA: Diagnosis not present

## 2019-02-28 LAB — CUP PACEART REMOTE DEVICE CHECK
Date Time Interrogation Session: 20200725020935
Implantable Pulse Generator Implant Date: 20190624

## 2019-03-06 NOTE — Progress Notes (Signed)
[facility] Summary Report / Loop Recorder 

## 2019-03-17 ENCOUNTER — Telehealth: Payer: Self-pay | Admitting: Neurology

## 2019-03-17 NOTE — Telephone Encounter (Signed)
Pt is asking for a call from RN to know if her upcoming appointment can be a televisit or not.  Please call

## 2019-03-18 NOTE — Telephone Encounter (Addendum)
I called pt and made appt telev -visit using [phone].  Prefers to have since covid, and pcp says to stay away from groups.  No camera's on computer or smartphone.

## 2019-03-18 NOTE — Telephone Encounter (Signed)
Is there a reason that she does not want to come in for an office visit?  If she does not have any specific concerns or questions at this time, we can always reschedule out for a later date.  If she absolutely insists on doing telephone visit at this time, we can do that for the this one time but would be encouraged for either office visit or video visit

## 2019-03-18 NOTE — Telephone Encounter (Signed)
I called pt and asked if they were able to do VV.  They stated did not have smartphone or laptop with camera, and wants to do telephone visit.  Are you ok to do this?

## 2019-04-01 ENCOUNTER — Telehealth: Payer: Self-pay | Admitting: Adult Health

## 2019-04-01 ENCOUNTER — Other Ambulatory Visit: Payer: Self-pay

## 2019-04-01 ENCOUNTER — Ambulatory Visit (INDEPENDENT_AMBULATORY_CARE_PROVIDER_SITE_OTHER): Payer: Medicare Other | Admitting: *Deleted

## 2019-04-01 ENCOUNTER — Ambulatory Visit (INDEPENDENT_AMBULATORY_CARE_PROVIDER_SITE_OTHER): Payer: Medicare Other | Admitting: Adult Health

## 2019-04-01 ENCOUNTER — Encounter: Payer: Self-pay | Admitting: Adult Health

## 2019-04-01 VITALS — BP 136/70 | HR 68 | Temp 97.8°F | Wt 184.4 lb

## 2019-04-01 DIAGNOSIS — E785 Hyperlipidemia, unspecified: Secondary | ICD-10-CM | POA: Diagnosis not present

## 2019-04-01 DIAGNOSIS — G40909 Epilepsy, unspecified, not intractable, without status epilepticus: Secondary | ICD-10-CM

## 2019-04-01 DIAGNOSIS — I6330 Cerebral infarction due to thrombosis of unspecified cerebral artery: Secondary | ICD-10-CM | POA: Diagnosis not present

## 2019-04-01 DIAGNOSIS — I10 Essential (primary) hypertension: Secondary | ICD-10-CM

## 2019-04-01 DIAGNOSIS — I639 Cerebral infarction, unspecified: Secondary | ICD-10-CM | POA: Diagnosis not present

## 2019-04-01 NOTE — Progress Notes (Signed)
I agree with the above plan 

## 2019-04-01 NOTE — Telephone Encounter (Signed)
Noted and # is in appt notes.

## 2019-04-01 NOTE — Progress Notes (Signed)
[facility] [address] [place]. [place] [id number] [phone]        FOLLOW-UP NOTE  [name] Sortor Date of Birth:  26-May-1946 Medical Record Number:  [id number]    [facility] [address] [place]. [place] [id number] [phone]     Virtual Visit via Telephone Note  I connected with April Mcdonald Neilan on 04/01/19 at 10:45 AM EDT by [facility] and verified that I am speaking with the correct person using two identifiers who reports being located at her home.    Visit scheduled by Hermenia FiscalSandra Young, RN. She discussed the limitations, risks, security and privacy concerns of performing an evaluation and management service by telephone and the availability of in person appointments. I also discussed with the patient that there may be a patient responsible charge related to this service. The patient expressed understanding and agreed to proceed. See telephone note for consent and additional scheduling information.      HPI:   Initial visit 03/31/2018 PS;Ms. Darcel BayleyLeonard is a pleasant 73 year old Caucasian lady seen today for initial office follow-up visit for hospital admission for stroke in June 2019. She is accompanied by her husband. History is obtained from them as well as review of electronic medical records. I have personally reviewed imaging films.Dajai O Lanieris an 73 y.o.femalewho has medical history of HTN, HLD and seizure disorder. She presented to a local Urgent Care center on evening of 01/24/18 with sudden onset of dizziness, slurred speech that lasted about 5-7910min and then she states she simply felt "unwell". [facility]. CTH showed no acute findings, but MRI showed patchy acute ischemic nonhemorrhagic infarcts involving bilateral cerebellar hemispheres right greater than left. There is also small remote age right frontal cortical infarct.. She denies any CP or  palpitations or SOB. She had no LOC or vision changes. She said she had a focal right h/a thereafter, but this is now gone and all symptoms have resolved at this time. NIH 0. To note, her seizure disorder has been well controlled on Dilantin and has been seizure free for over 567yrs and at baseline is healthy and active (mRS 0).MR angiogram of the head showed only mild intracranial atherosclerotic changes. CT angiogram of the neck showed no significant extracranial stenosis. Transthoracic echo showed normal ejection fraction. Transesophageal echo showed no cord excessive embolism, vegetation or clot. LDL cholesterol was 110 mg percent and hemoglobin A1c was 5.8. Patient had a loop recorder inserted insofar paroxysmal A. Fib has not yet been found. She states she meant for neurological recovery and has no trouble with dizziness and balance. She is tolerating aspirin and Plavix well but does complain of easy bruising. She has had no bleeding episodes. She states her blood pressure is well controlled and today it is borderline at 138/67. She is tolerating Lipitor 80 mg well without muscle aches and pains. She has an appointment with primary care physician tomorrow and plans to have follow-up lipid profile checked. She's had no recurrent stroke or TIA symptoms. She has remote history of 2 seizures in 1980s and has been on Dilantin 400 mg daily which is tolerating well without side effects and does not want to reduce the dose. andhenytoin level in the hospital was 16.8 mg percent  04/01/2019 update:  [name] is a 73 year old female who did have scheduled follow-up visit but due to COVID-19 restrictions, visit rescheduled with patient requesting today's visit via virtual  visit via telephone due to concerns of possible COVID-19 exposure with coming to office.  She has been doing well since prior visit without recurrent stroke/TIA symptoms or seizure activity.  She continues on phenytoin 400 mg daily for seizure  prophylaxis and currently being managed by PCP.  Continues on aspirin 81 mg and atorvastatin 80 mg daily for secondary stroke prevention without side effects.  Blood pressure stable with today's reading 136/70 which she obtained prior to visit.  Recently 184 pounds endorsing a 25 pound weight loss through diet and exercise.  DM continues to be controlled by diet with ongoing followed by PCP.  Loop recorder has not shown atrial fibrillation thus far.  No concerns at this time.     ROS:   14 system review of systems is positive for  No complaints today and all systems negative  PMH:  Past Medical History:  Diagnosis Date   Hypertension    Seizures (HCC)    Stroke (HCC)     Social History:  Social History   Socioeconomic History   Marital status: Married    Spouse name: Not on file   Number of children: Not on file   Years of education: Not on file   Highest education level: Not on file  Occupational History   Not on file  Social Needs   Financial resource strain: Not on file   Food insecurity    Worry: Not on file    Inability: Not on file   Transportation needs    Medical: Not on file    Non-medical: Not on file  Tobacco Use   Smoking status: Never Smoker   Smokeless tobacco: Never Used  Substance and Sexual Activity   Alcohol use: Never    Frequency: Never   Drug use: Never   Sexual activity: Not on file  Lifestyle   Physical activity    Days per week: Not on file    Minutes per session: Not on file   Stress: Not on file  Relationships   Social connections    Talks on phone: Not on file    Gets together: Not on file    Attends religious service: Not on file    Active member of club or organization: Not on file    Attends meetings of clubs or organizations: Not on file    Relationship status: Not on file   Intimate partner violence    Fear of current or ex partner: Not on file    Emotionally abused: Not on file    Physically abused: Not on  file    Forced sexual activity: Not on file  Other Topics Concern   Not on file  Social History Narrative   Not on file    Medications:   Current Outpatient Medications on File Prior to Visit  Medication Sig Dispense Refill   ascorbic acid (VITAMIN C) 500 MG tablet Take 500 mg by mouth daily.     aspirin EC 81 MG EC tablet Take 1 tablet (81 mg total) by mouth daily. 30 tablet 0   atorvastatin (LIPITOR) 80 MG tablet Take 1 tablet (80 mg total) by mouth daily at 6 PM. 30 tablet 0   Carboxymethylcellulose Sodium (EYE DROPS OP) Place 2-3 drops into both eyes daily as needed (Itchy eyes).     Cholecalciferol (VITAMIN D3) 1000 units CAPS Take 2,000 Units by mouth daily.     diphenoxylate-atropine (LOMOTIL) 2.5-0.025 MG tablet Take by mouth 4 (four) times daily as needed  for diarrhea or loose stools.     fluticasone (FLONASE) 50 MCG/ACT nasal spray Place 2 sprays into both nostrils daily as needed for congestion.     loratadine (CLARITIN) 10 MG tablet Take 10 mg by mouth daily as needed for allergies.     phenytoin (DILANTIN) 100 MG ER capsule Take 400 mg by mouth daily.      triamterene-hydrochlorothiazide (DYAZIDE) 37.5-25 MG capsule Take 1 each (1 capsule total) by mouth daily.     vitamin B-12 (CYANOCOBALAMIN) 500 MCG tablet Take 500 mcg by mouth daily.     No current facility-administered medications on file prior to visit.     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 Vascular risk factors of hypertension and hyperlipidemia and mild obesity.she has remote history of 2 seizures in 1980s and has been on Dilantin 400 mg daily since then without recurrent breakthrough seizures and does not want to reduce the dose.  Recorder has not shown atrial fibrillation thus far.  Has been stable from a stroke standpoint without residual deficits or reoccurring symptoms     PLAN: -Continue aspirin 81 mg and atorvastatin 80 mg daily for secondary stroke prevention -Continue phenytoin 400 mg daily for seizure prophylaxis -currently being managed by PCP -Continue to follow with PCP for DM, HLD, HTN and seizure management -Discussion regarding length of loop recorder and ongoing monitoring -Congratulated on recent weight loss and encouraged continuation - maintain strict control of hypertension with blood pressure goal below 130/90, diabetes with hemoglobin A1c goal below 6.5% and lipids with LDL cholesterol goal below 70 mg/dL. I also advised the patient to eat a healthy diet with plenty of whole grains, cereals, fruits and vegetables, exercise regularly and maintain ideal body weight.   Overall stable from a stroke standpoint and recommend follow-up as needed  Greater than 50% of this 12-minute telephone conversation was spent discussing prior stroke, review of loop recorder, review of medications with further discussion and answering all questions to patient satisfaction  Frann Rider, [facility]  [facility] [address] [place], [place] [address]  Phone [phone] Fax [phone] Note: This document was prepared with digital dictation and possible smart phrase technology. Any transcriptional errors that result from this process are unintentional.

## 2019-04-01 NOTE — Telephone Encounter (Signed)
Pt's husband called stating that the house phone is out of service and he would like [phone] called for the Tele Visit for today.

## 2019-04-02 LAB — CUP PACEART REMOTE DEVICE CHECK
Date Time Interrogation Session: 20200827023950
Implantable Pulse Generator Implant Date: 20190624

## 2019-04-10 NOTE — Progress Notes (Signed)
[facility] Summary Report / Loop Recorder 

## 2019-04-29 ENCOUNTER — Telehealth: Payer: Self-pay

## 2019-04-29 NOTE — Telephone Encounter (Signed)
Left message for patient to regarding disconnected monitor 

## 2019-05-04 ENCOUNTER — Ambulatory Visit (INDEPENDENT_AMBULATORY_CARE_PROVIDER_SITE_OTHER): Payer: Medicare Other | Admitting: *Deleted

## 2019-05-04 DIAGNOSIS — I6330 Cerebral infarction due to thrombosis of unspecified cerebral artery: Secondary | ICD-10-CM | POA: Diagnosis not present

## 2019-05-05 LAB — CUP PACEART REMOTE DEVICE CHECK
Date Time Interrogation Session: 20200929191457
Implantable Pulse Generator Implant Date: 20190624

## 2019-05-13 NOTE — Progress Notes (Signed)
[facility] Summary Report / Loop Recorder 

## 2019-06-07 LAB — CUP PACEART REMOTE DEVICE CHECK
Date Time Interrogation Session: 20201101183646
Implantable Pulse Generator Implant Date: 20190624

## 2019-06-08 ENCOUNTER — Ambulatory Visit (INDEPENDENT_AMBULATORY_CARE_PROVIDER_SITE_OTHER): Payer: Medicare Other | Admitting: *Deleted

## 2019-06-08 DIAGNOSIS — I6330 Cerebral infarction due to thrombosis of unspecified cerebral artery: Secondary | ICD-10-CM | POA: Diagnosis not present

## 2019-07-01 NOTE — Progress Notes (Signed)
[facility] Summary Report / Loop Recorder 

## 2019-07-10 ENCOUNTER — Ambulatory Visit (INDEPENDENT_AMBULATORY_CARE_PROVIDER_SITE_OTHER): Payer: Medicare Other | Admitting: *Deleted

## 2019-07-10 DIAGNOSIS — I6330 Cerebral infarction due to thrombosis of unspecified cerebral artery: Secondary | ICD-10-CM | POA: Diagnosis not present

## 2019-07-11 LAB — CUP PACEART REMOTE DEVICE CHECK
Date Time Interrogation Session: 20201204141438
Implantable Pulse Generator Implant Date: 20190624

## 2019-08-12 ENCOUNTER — Ambulatory Visit (INDEPENDENT_AMBULATORY_CARE_PROVIDER_SITE_OTHER): Payer: Medicare PPO | Admitting: *Deleted

## 2019-08-12 DIAGNOSIS — I6330 Cerebral infarction due to thrombosis of unspecified cerebral artery: Secondary | ICD-10-CM

## 2019-08-13 LAB — CUP PACEART REMOTE DEVICE CHECK
Date Time Interrogation Session: 20210106143455
Implantable Pulse Generator Implant Date: 20190624

## 2019-08-21 ENCOUNTER — Telehealth: Payer: Self-pay

## 2019-08-21 NOTE — Telephone Encounter (Signed)
The pt husband states the power went out and when it came back on it showed them to press the button. I told him I understand and will let the nurse know that is why they sent.

## 2019-09-14 ENCOUNTER — Ambulatory Visit (INDEPENDENT_AMBULATORY_CARE_PROVIDER_SITE_OTHER): Payer: Medicare HMO | Admitting: *Deleted

## 2019-09-14 DIAGNOSIS — I6330 Cerebral infarction due to thrombosis of unspecified cerebral artery: Secondary | ICD-10-CM | POA: Diagnosis not present

## 2019-09-14 LAB — CUP PACEART REMOTE DEVICE CHECK
Date Time Interrogation Session: 20210207235654
Implantable Pulse Generator Implant Date: 20190624

## 2019-09-15 NOTE — Progress Notes (Signed)
ILR Remote 

## 2019-10-15 ENCOUNTER — Ambulatory Visit (INDEPENDENT_AMBULATORY_CARE_PROVIDER_SITE_OTHER): Payer: Medicare HMO | Admitting: *Deleted

## 2019-10-15 DIAGNOSIS — I6330 Cerebral infarction due to thrombosis of unspecified cerebral artery: Secondary | ICD-10-CM | POA: Diagnosis not present

## 2019-10-15 LAB — CUP PACEART REMOTE DEVICE CHECK
Date Time Interrogation Session: 20210311002243
Implantable Pulse Generator Implant Date: 20190624

## 2019-10-15 NOTE — Progress Notes (Signed)
ILR Remote 

## 2019-11-16 ENCOUNTER — Ambulatory Visit (INDEPENDENT_AMBULATORY_CARE_PROVIDER_SITE_OTHER): Payer: Medicare HMO | Admitting: *Deleted

## 2019-11-16 DIAGNOSIS — I6330 Cerebral infarction due to thrombosis of unspecified cerebral artery: Secondary | ICD-10-CM | POA: Diagnosis not present

## 2019-11-16 LAB — CUP PACEART REMOTE DEVICE CHECK
Date Time Interrogation Session: 20210411033304
Implantable Pulse Generator Implant Date: 20190624

## 2019-11-17 NOTE — Progress Notes (Signed)
ILR Remote 

## 2019-12-17 LAB — CUP PACEART REMOTE DEVICE CHECK
Date Time Interrogation Session: 20210512032950
Implantable Pulse Generator Implant Date: 20190624

## 2019-12-21 ENCOUNTER — Ambulatory Visit (INDEPENDENT_AMBULATORY_CARE_PROVIDER_SITE_OTHER): Payer: Medicare HMO | Admitting: *Deleted

## 2019-12-21 DIAGNOSIS — I6330 Cerebral infarction due to thrombosis of unspecified cerebral artery: Secondary | ICD-10-CM

## 2019-12-22 NOTE — Progress Notes (Signed)
[facility] Summary Report / Loop Recorder 

## 2019-12-28 ENCOUNTER — Other Ambulatory Visit: Payer: Self-pay | Admitting: Internal Medicine

## 2019-12-28 DIAGNOSIS — Z1231 Encounter for screening mammogram for malignant neoplasm of breast: Secondary | ICD-10-CM

## 2020-01-25 ENCOUNTER — Ambulatory Visit (INDEPENDENT_AMBULATORY_CARE_PROVIDER_SITE_OTHER): Payer: Medicare HMO | Admitting: *Deleted

## 2020-01-25 DIAGNOSIS — I6330 Cerebral infarction due to thrombosis of unspecified cerebral artery: Secondary | ICD-10-CM

## 2020-01-25 LAB — CUP PACEART REMOTE DEVICE CHECK
Date Time Interrogation Session: 20210620235840
Implantable Pulse Generator Implant Date: 20190624

## 2020-01-26 NOTE — Progress Notes (Signed)
[facility] Summary Report / Loop Recorder 

## 2020-02-02 ENCOUNTER — Other Ambulatory Visit: Payer: Self-pay

## 2020-02-02 ENCOUNTER — Ambulatory Visit
Admission: RE | Admit: 2020-02-02 | Discharge: 2020-02-02 | Disposition: A | Discharge status: Home / Self Care | Payer: Medicare HMO | Source: Ambulatory Visit | Attending: Internal Medicine | Admitting: Internal Medicine

## 2020-02-02 DIAGNOSIS — Z1231 Encounter for screening mammogram for malignant neoplasm of breast: Secondary | ICD-10-CM

## 2020-02-29 ENCOUNTER — Ambulatory Visit (INDEPENDENT_AMBULATORY_CARE_PROVIDER_SITE_OTHER): Payer: Medicare HMO | Admitting: *Deleted

## 2020-02-29 DIAGNOSIS — I6330 Cerebral infarction due to thrombosis of unspecified cerebral artery: Secondary | ICD-10-CM | POA: Diagnosis not present

## 2020-03-01 LAB — CUP PACEART REMOTE DEVICE CHECK
Date Time Interrogation Session: 20210725232858
Implantable Pulse Generator Implant Date: 20190624

## 2020-03-02 NOTE — Progress Notes (Signed)
[facility] Summary Report / Loop Recorder 

## 2020-04-01 LAB — CUP PACEART REMOTE DEVICE CHECK
Date Time Interrogation Session: 20210825234434
Implantable Pulse Generator Implant Date: 20190624

## 2020-04-04 ENCOUNTER — Ambulatory Visit (INDEPENDENT_AMBULATORY_CARE_PROVIDER_SITE_OTHER): Payer: Medicare HMO | Admitting: *Deleted

## 2020-04-04 DIAGNOSIS — I6330 Cerebral infarction due to thrombosis of unspecified cerebral artery: Secondary | ICD-10-CM | POA: Diagnosis not present

## 2020-04-06 NOTE — Progress Notes (Signed)
[facility] Summary Report / Loop Recorder 

## 2020-05-09 ENCOUNTER — Ambulatory Visit (INDEPENDENT_AMBULATORY_CARE_PROVIDER_SITE_OTHER): Payer: Medicare HMO

## 2020-05-09 DIAGNOSIS — I6330 Cerebral infarction due to thrombosis of unspecified cerebral artery: Secondary | ICD-10-CM

## 2020-05-09 LAB — CUP PACEART REMOTE DEVICE CHECK
Date Time Interrogation Session: 20210925234355
Implantable Pulse Generator Implant Date: 20190624

## 2020-05-10 NOTE — Progress Notes (Signed)
[facility] Summary Report / Loop Recorder 

## 2020-06-01 ENCOUNTER — Ambulatory Visit (INDEPENDENT_AMBULATORY_CARE_PROVIDER_SITE_OTHER): Payer: Medicare HMO

## 2020-06-01 DIAGNOSIS — I6330 Cerebral infarction due to thrombosis of unspecified cerebral artery: Secondary | ICD-10-CM | POA: Diagnosis not present

## 2020-06-01 LAB — CUP PACEART REMOTE DEVICE CHECK
Date Time Interrogation Session: 20211026234753
Implantable Pulse Generator Implant Date: 20190624

## 2020-06-06 NOTE — Progress Notes (Signed)
[facility] Summary Report / Loop Recorder 

## 2020-07-03 LAB — CUP PACEART REMOTE DEVICE CHECK
Date Time Interrogation Session: 20211126224902
Implantable Pulse Generator Implant Date: 20190624

## 2020-07-04 ENCOUNTER — Ambulatory Visit (INDEPENDENT_AMBULATORY_CARE_PROVIDER_SITE_OTHER): Payer: Medicare HMO

## 2020-07-04 DIAGNOSIS — I6330 Cerebral infarction due to thrombosis of unspecified cerebral artery: Secondary | ICD-10-CM | POA: Diagnosis not present

## 2020-07-11 NOTE — Progress Notes (Signed)
[facility] Summary Report / Loop Recorder 

## 2020-08-03 LAB — CUP PACEART REMOTE DEVICE CHECK
Date Time Interrogation Session: 20211227225036
Implantable Pulse Generator Implant Date: 20190624

## 2020-08-08 ENCOUNTER — Ambulatory Visit (INDEPENDENT_AMBULATORY_CARE_PROVIDER_SITE_OTHER): Payer: Medicare HMO

## 2020-08-08 DIAGNOSIS — I6330 Cerebral infarction due to thrombosis of unspecified cerebral artery: Secondary | ICD-10-CM | POA: Diagnosis not present

## 2020-08-23 NOTE — Progress Notes (Signed)
[facility] Summary Report / Loop Recorder 

## 2020-09-02 ENCOUNTER — Ambulatory Visit (INDEPENDENT_AMBULATORY_CARE_PROVIDER_SITE_OTHER): Payer: Medicare HMO

## 2020-09-02 DIAGNOSIS — I6330 Cerebral infarction due to thrombosis of unspecified cerebral artery: Secondary | ICD-10-CM | POA: Diagnosis not present

## 2020-09-02 LAB — CUP PACEART REMOTE DEVICE CHECK
Date Time Interrogation Session: 20220127225543
Implantable Pulse Generator Implant Date: 20190624

## 2020-09-10 NOTE — Progress Notes (Signed)
[facility] Summary Report / Loop Recorder 

## 2020-10-03 ENCOUNTER — Ambulatory Visit (INDEPENDENT_AMBULATORY_CARE_PROVIDER_SITE_OTHER): Payer: Medicare HMO

## 2020-10-03 DIAGNOSIS — I6330 Cerebral infarction due to thrombosis of unspecified cerebral artery: Secondary | ICD-10-CM | POA: Diagnosis not present

## 2020-10-03 LAB — CUP PACEART REMOTE DEVICE CHECK
Date Time Interrogation Session: 20220227225621
Implantable Pulse Generator Implant Date: 20190624

## 2020-10-11 NOTE — Progress Notes (Signed)
[facility] Summary Report / Loop Recorder 

## 2020-11-03 ENCOUNTER — Ambulatory Visit (INDEPENDENT_AMBULATORY_CARE_PROVIDER_SITE_OTHER): Payer: Medicare HMO

## 2020-11-03 ENCOUNTER — Other Ambulatory Visit: Payer: Self-pay

## 2020-11-03 DIAGNOSIS — I6330 Cerebral infarction due to thrombosis of unspecified cerebral artery: Secondary | ICD-10-CM

## 2020-11-03 LAB — CUP PACEART REMOTE DEVICE CHECK
Date Time Interrogation Session: 20220330235801
Implantable Pulse Generator Implant Date: 20190624

## 2020-11-16 NOTE — Progress Notes (Signed)
[facility] Summary Report / Loop Recorder 

## 2020-12-05 ENCOUNTER — Ambulatory Visit (INDEPENDENT_AMBULATORY_CARE_PROVIDER_SITE_OTHER): Payer: Medicare HMO

## 2020-12-05 DIAGNOSIS — I6330 Cerebral infarction due to thrombosis of unspecified cerebral artery: Secondary | ICD-10-CM | POA: Diagnosis not present

## 2020-12-06 LAB — CUP PACEART REMOTE DEVICE CHECK
Date Time Interrogation Session: 20220501000217
Implantable Pulse Generator Implant Date: 20190624

## 2020-12-26 NOTE — Progress Notes (Signed)
[facility] Summary Report / Loop Recorder 

## 2021-01-04 LAB — CUP PACEART REMOTE DEVICE CHECK
Date Time Interrogation Session: 20220601002931
Implantable Pulse Generator Implant Date: 20190624

## 2021-01-05 ENCOUNTER — Ambulatory Visit (INDEPENDENT_AMBULATORY_CARE_PROVIDER_SITE_OTHER): Payer: Medicare HMO

## 2021-01-05 DIAGNOSIS — I6330 Cerebral infarction due to thrombosis of unspecified cerebral artery: Secondary | ICD-10-CM | POA: Diagnosis not present

## 2021-01-30 NOTE — Progress Notes (Signed)
[facility] Summary Report / Loop Recorder 

## 2021-02-07 ENCOUNTER — Ambulatory Visit (INDEPENDENT_AMBULATORY_CARE_PROVIDER_SITE_OTHER): Payer: Medicare HMO

## 2021-02-07 DIAGNOSIS — I6330 Cerebral infarction due to thrombosis of unspecified cerebral artery: Secondary | ICD-10-CM | POA: Diagnosis not present

## 2021-02-08 LAB — CUP PACEART REMOTE DEVICE CHECK
Date Time Interrogation Session: 20220702003236
Implantable Pulse Generator Implant Date: 20190624

## 2021-02-28 NOTE — Progress Notes (Signed)
[facility] Summary Report / Loop Recorder 

## 2021-03-08 LAB — CUP PACEART REMOTE DEVICE CHECK
Date Time Interrogation Session: 20220802005121
Implantable Pulse Generator Implant Date: 20190624

## 2021-03-09 ENCOUNTER — Ambulatory Visit (INDEPENDENT_AMBULATORY_CARE_PROVIDER_SITE_OTHER): Payer: Medicare HMO

## 2021-03-09 DIAGNOSIS — I6330 Cerebral infarction due to thrombosis of unspecified cerebral artery: Secondary | ICD-10-CM | POA: Diagnosis not present

## 2021-04-03 NOTE — Progress Notes (Signed)
[facility] Summary Report / Loop Recorder 

## 2021-04-11 ENCOUNTER — Ambulatory Visit (INDEPENDENT_AMBULATORY_CARE_PROVIDER_SITE_OTHER): Payer: Medicare HMO

## 2021-04-11 DIAGNOSIS — I6330 Cerebral infarction due to thrombosis of unspecified cerebral artery: Secondary | ICD-10-CM | POA: Diagnosis not present

## 2021-04-11 LAB — CUP PACEART REMOTE DEVICE CHECK
Date Time Interrogation Session: 20220902005235
Implantable Pulse Generator Implant Date: 20190624

## 2021-04-20 NOTE — Progress Notes (Signed)
[facility] Summary Report / Loop Recorder 

## 2021-05-10 LAB — CUP PACEART REMOTE DEVICE CHECK
Date Time Interrogation Session: 20221003005821
Implantable Pulse Generator Implant Date: 20190624

## 2021-05-11 ENCOUNTER — Ambulatory Visit (INDEPENDENT_AMBULATORY_CARE_PROVIDER_SITE_OTHER): Payer: Medicare HMO

## 2021-05-11 DIAGNOSIS — I6330 Cerebral infarction due to thrombosis of unspecified cerebral artery: Secondary | ICD-10-CM | POA: Diagnosis not present

## 2021-05-19 NOTE — Progress Notes (Signed)
[facility] Summary Report / Loop Recorder 

## 2021-06-08 LAB — CUP PACEART REMOTE DEVICE CHECK
Date Time Interrogation Session: 20221103005852
Implantable Pulse Generator Implant Date: 20190624

## 2021-06-12 ENCOUNTER — Ambulatory Visit (INDEPENDENT_AMBULATORY_CARE_PROVIDER_SITE_OTHER): Payer: Medicare HMO

## 2021-06-12 DIAGNOSIS — I6330 Cerebral infarction due to thrombosis of unspecified cerebral artery: Secondary | ICD-10-CM

## 2021-06-20 NOTE — Progress Notes (Signed)
[facility] Summary Report / Loop Recorder 

## 2021-07-03 ENCOUNTER — Telehealth: Payer: Self-pay

## 2021-07-03 NOTE — Telephone Encounter (Signed)
Carelink alert received, device has reached RRT.    Spoke with pt, advised monitor can be unplugged and returned/  Carelink updated and return kit mailed out.    Future checks cancelled and Paceart/ Epic updated.    At current time pt does not required implant to be removed,  advised if she changes her mind to call office to schedule appt.

## 2021-07-03 NOTE — Telephone Encounter (Signed)
This encounter was created in error - please disregard.
